# Patient Record
Sex: Male | Born: 1975 | Race: White | Hispanic: No | Marital: Single | Smoking: Never smoker
Health system: Southern US, Community
[De-identification: ages and names within clinical notes are randomized; demographics above are authoritative.]

## PROBLEM LIST (undated history)

## (undated) DIAGNOSIS — R112 Nausea with vomiting, unspecified: Secondary | ICD-10-CM

## (undated) DIAGNOSIS — Z9889 Other specified postprocedural states: Secondary | ICD-10-CM

## (undated) DIAGNOSIS — Z8489 Family history of other specified conditions: Secondary | ICD-10-CM

## (undated) DIAGNOSIS — N200 Calculus of kidney: Secondary | ICD-10-CM

## (undated) DIAGNOSIS — M51369 Other intervertebral disc degeneration, lumbar region without mention of lumbar back pain or lower extremity pain: Secondary | ICD-10-CM

## (undated) DIAGNOSIS — G4730 Sleep apnea, unspecified: Secondary | ICD-10-CM

## (undated) DIAGNOSIS — M5136 Other intervertebral disc degeneration, lumbar region: Secondary | ICD-10-CM

## (undated) DIAGNOSIS — T8859XA Other complications of anesthesia, initial encounter: Secondary | ICD-10-CM

## (undated) DIAGNOSIS — I209 Angina pectoris, unspecified: Secondary | ICD-10-CM

## (undated) DIAGNOSIS — Z87442 Personal history of urinary calculi: Secondary | ICD-10-CM

## (undated) DIAGNOSIS — I10 Essential (primary) hypertension: Secondary | ICD-10-CM

## (undated) HISTORY — PX: INGUINAL HERNIA REPAIR: SUR1180

## (undated) HISTORY — PX: LEG SURGERY: SHX1003

## (undated) HISTORY — PX: FRACTURE SURGERY: SHX138

## (undated) HISTORY — PX: HERNIA REPAIR: SHX51

---

## 2003-05-18 ENCOUNTER — Other Ambulatory Visit: Payer: Self-pay

## 2004-01-14 ENCOUNTER — Emergency Department: Payer: Self-pay | Admitting: Internal Medicine

## 2012-11-08 ENCOUNTER — Emergency Department: Payer: Self-pay | Admitting: Emergency Medicine

## 2012-11-08 IMAGING — CR DG SHOULDER 3+V*R*
1 series · 2 of 2 positions shown · non-contrast
Comparison: none

REASON FOR EXAM: pain and injury
COMMENTS:

PROCEDURE:     DXR - DXR SHOULDER RIGHT COMPLETE  - [DATE]  [DATE]
RESULT:     Right shoulder images demonstrate no fracture, dislocation or
radiopaque foreign body.

[Series 1: external rotate · 0.17mm/px · 2 of 2 slices shown]
[im 1/2]
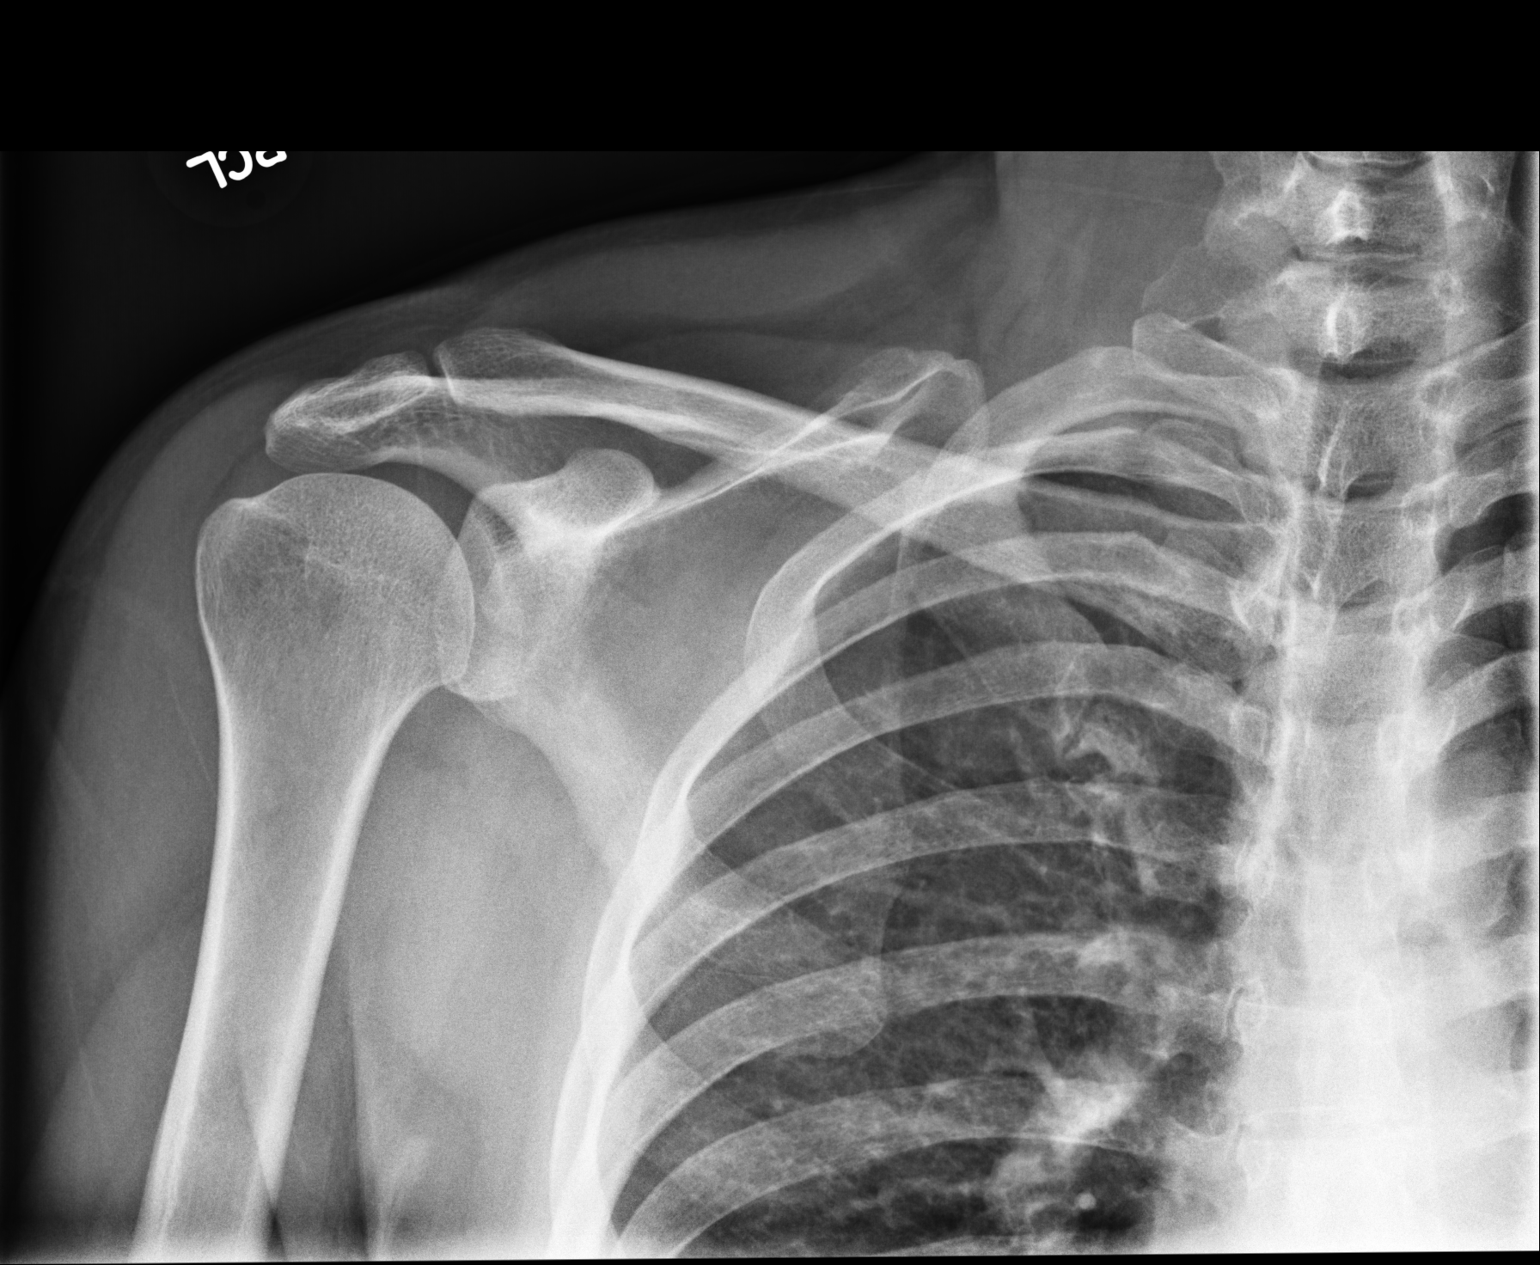
[im 2/2]
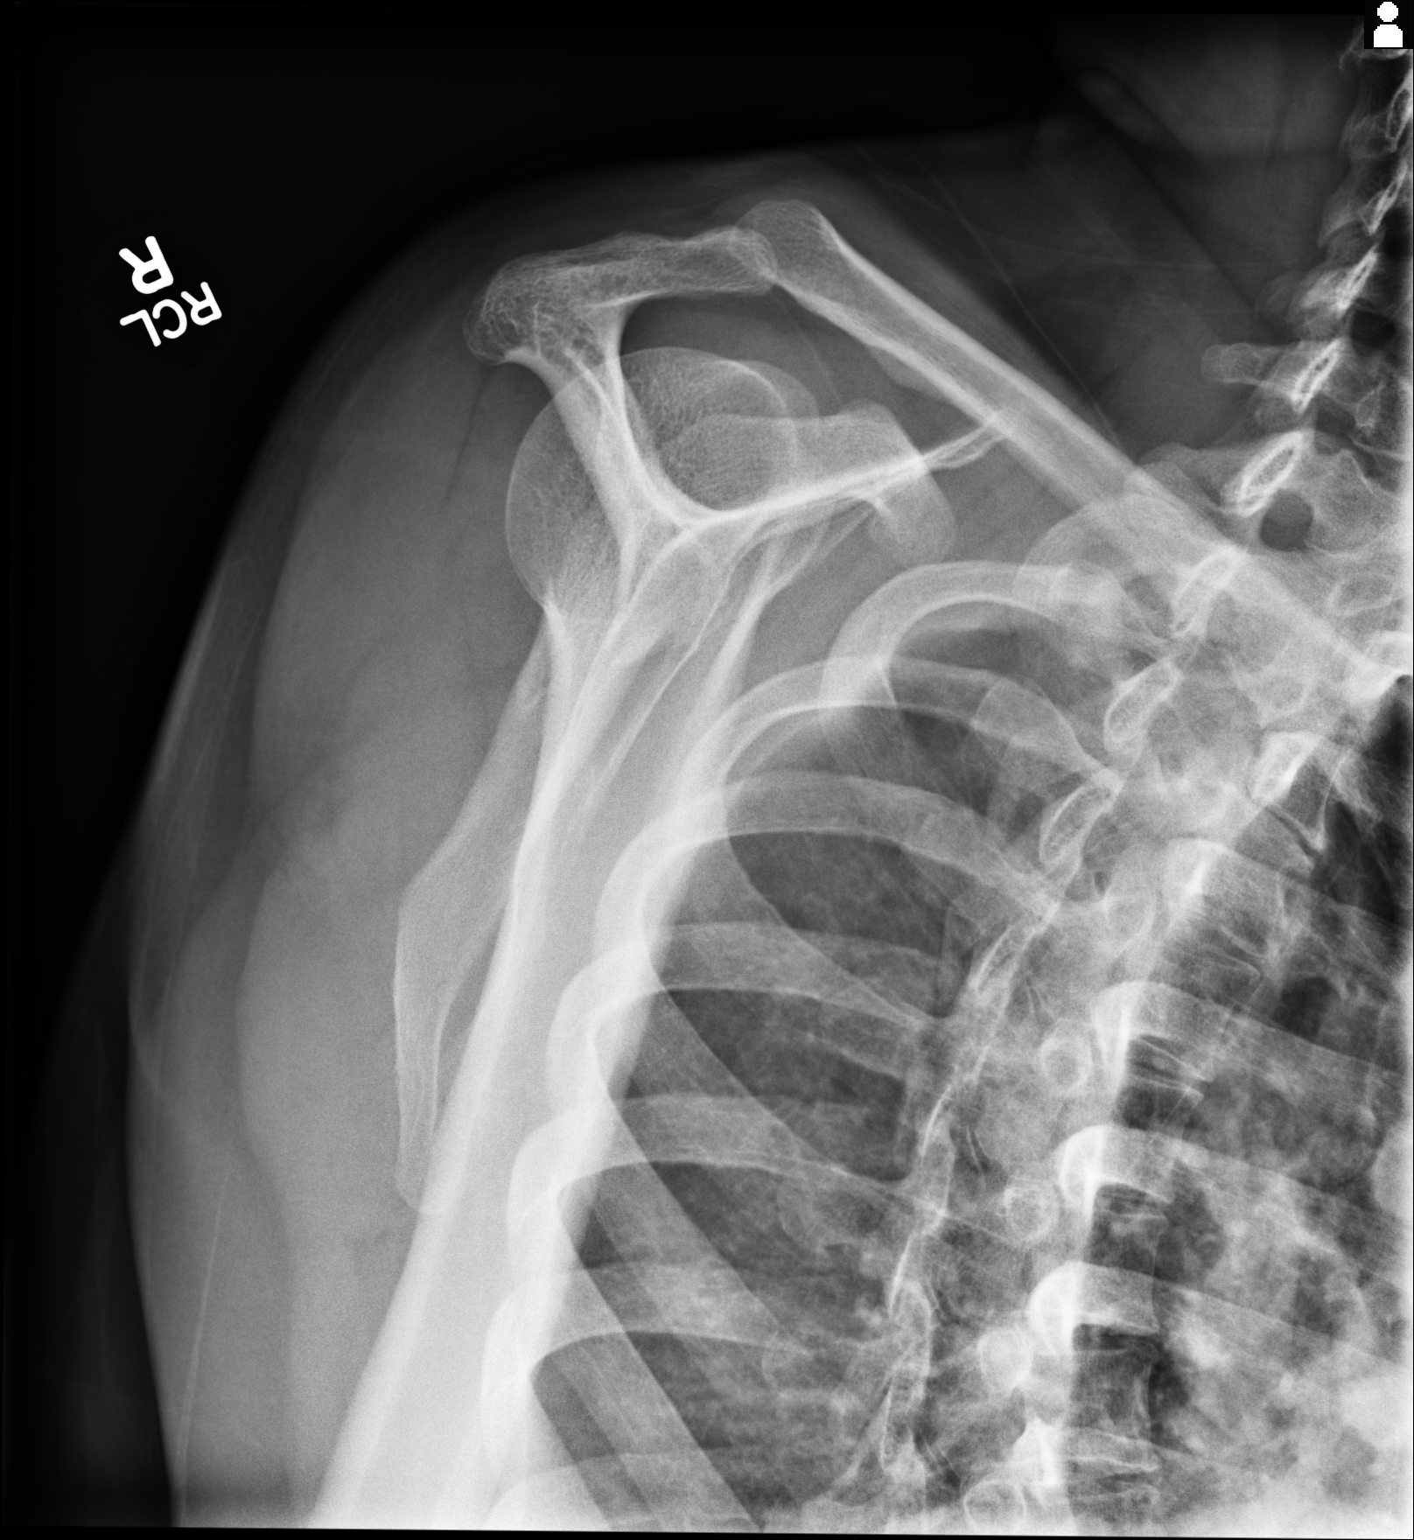

[2 of 2 positions shown; findings below may reference images not displayed]

IMPRESSION: Please see above.

[REDACTED]

## 2012-11-08 IMAGING — CR DG SHOULDER 3+V*R*
1 series · 1 of 1 positions shown · non-contrast
Comparison: none

REASON FOR EXAM: pain and injury
COMMENTS:

PROCEDURE:     DXR - DXR SHOULDER RIGHT COMPLETE  - [DATE]  [DATE]
RESULT:     Right shoulder images demonstrate no fracture, dislocation or
radiopaque foreign body.

[internal rotate]
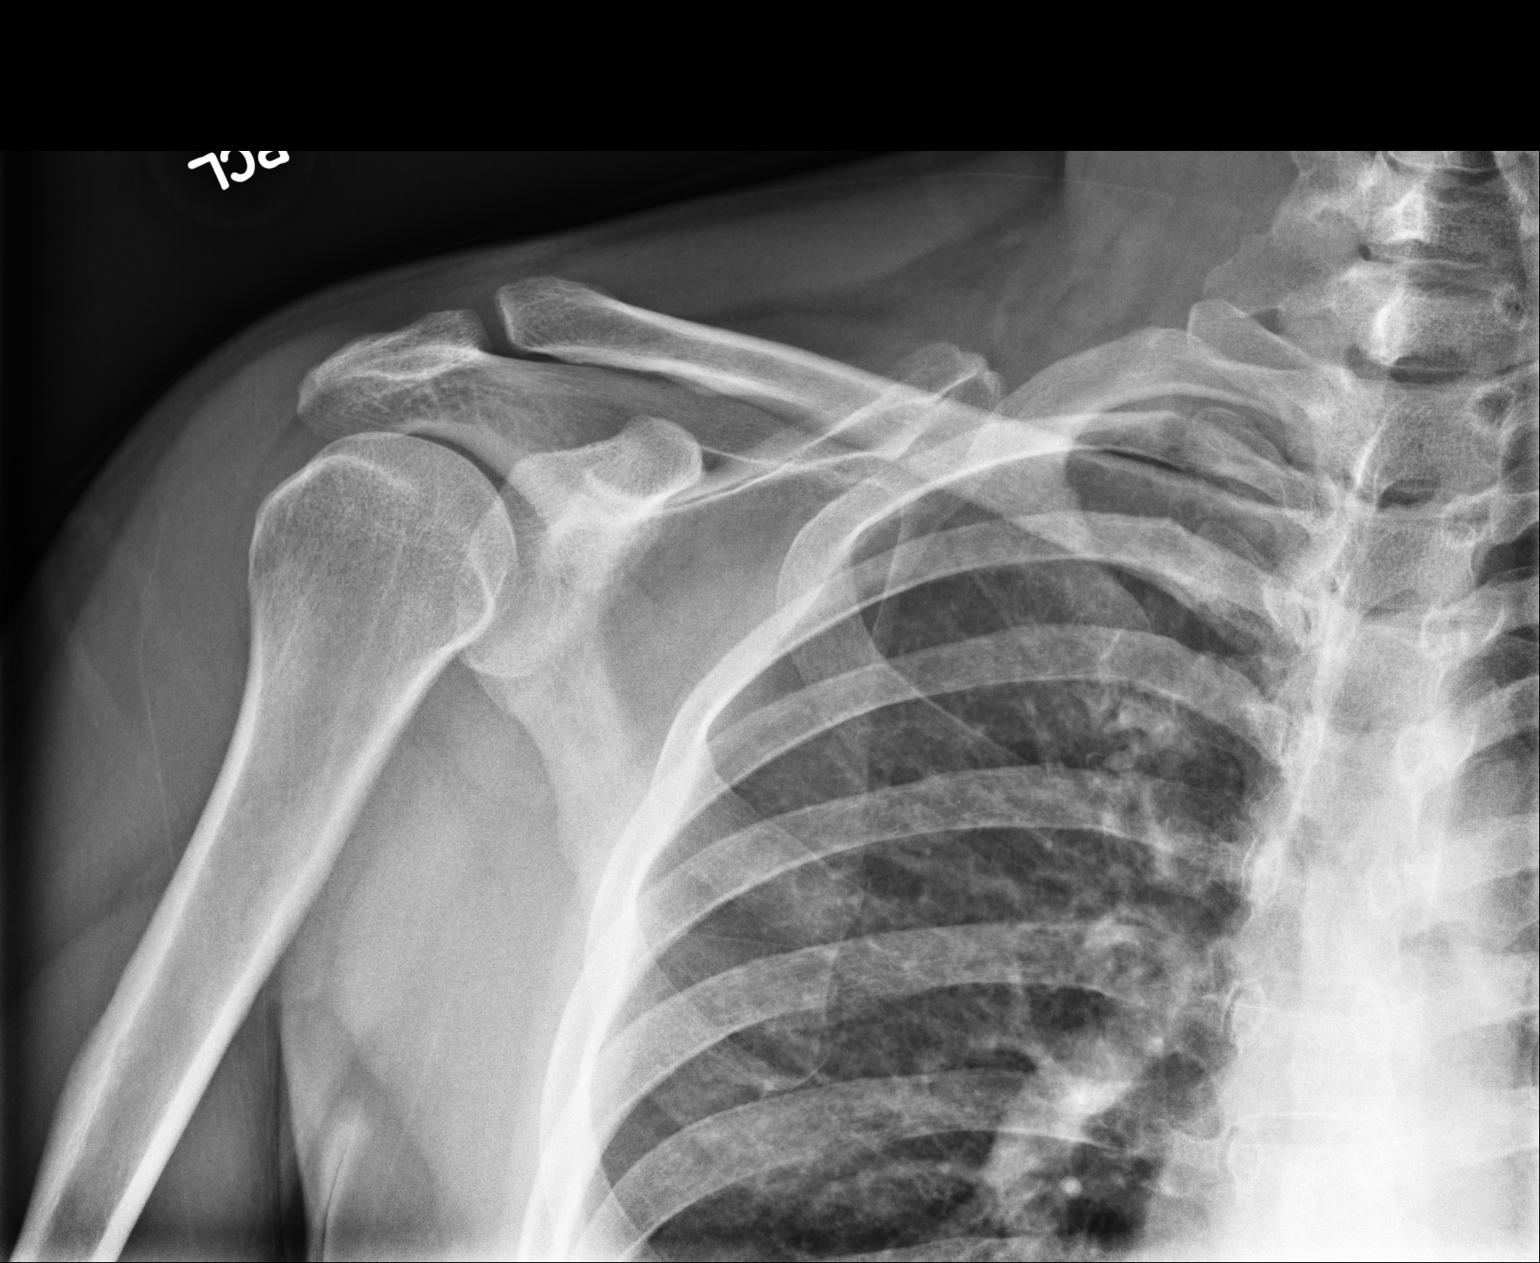

[1 of 1 positions shown; findings below may reference images not displayed]

IMPRESSION: Please see above.

[REDACTED]

## 2012-11-08 IMAGING — CR DG FOOT COMPLETE 3+V*L*
1 series · 3 of 3 positions shown · non-contrast
Comparison: none

REASON FOR EXAM: pain and injury
COMMENTS:   May transport without cardiac monitor

[Series 1: ap · 0.17mm/px · 3 of 3 slices shown]
[im 1/3]
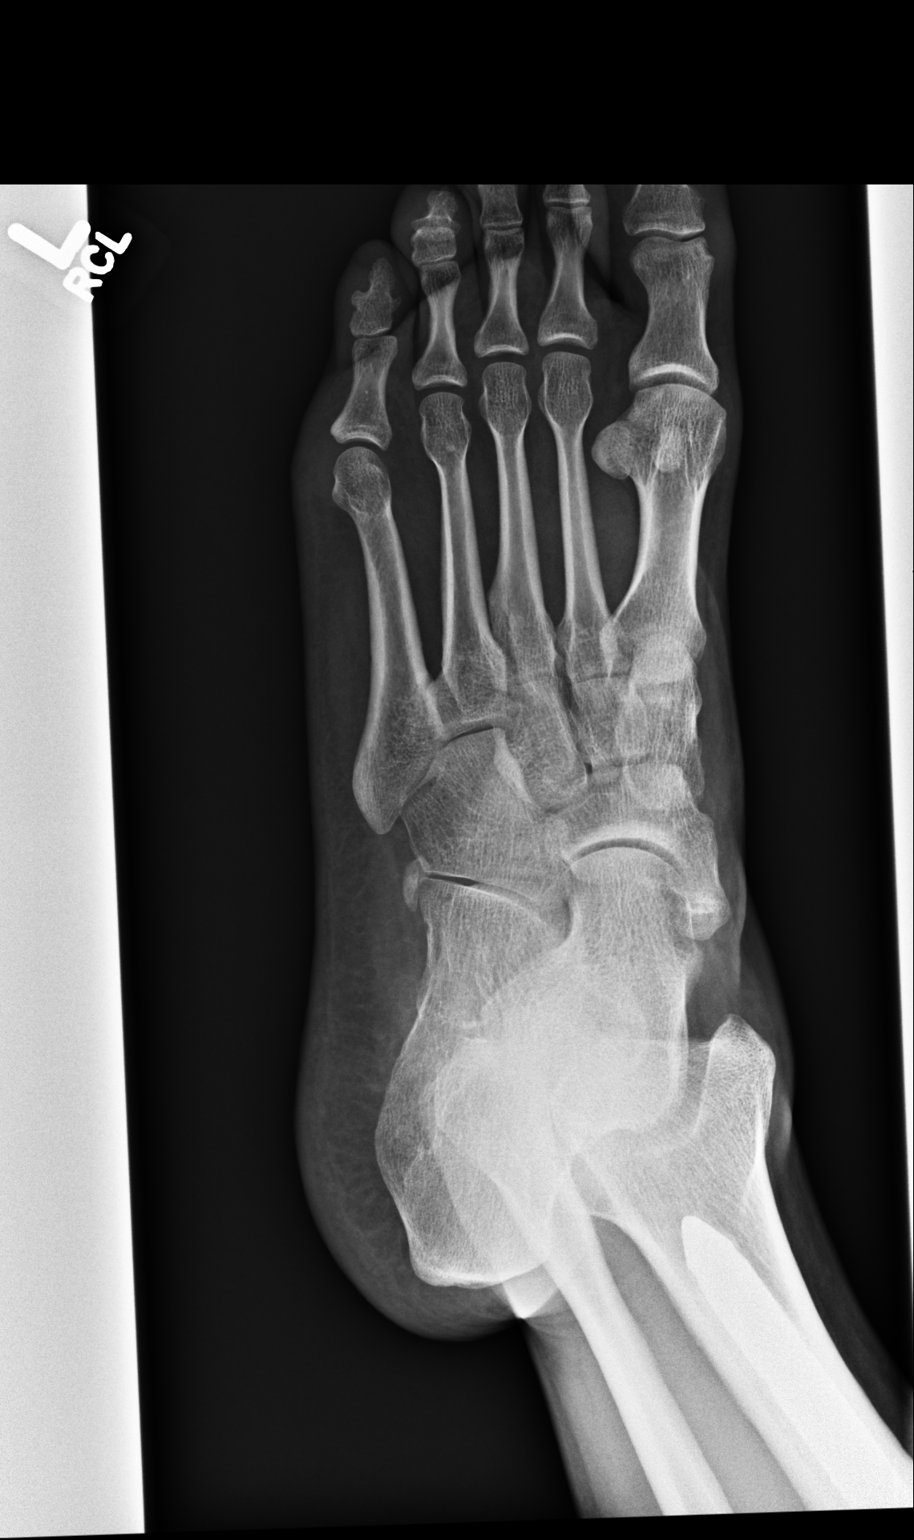
[im 2/3]
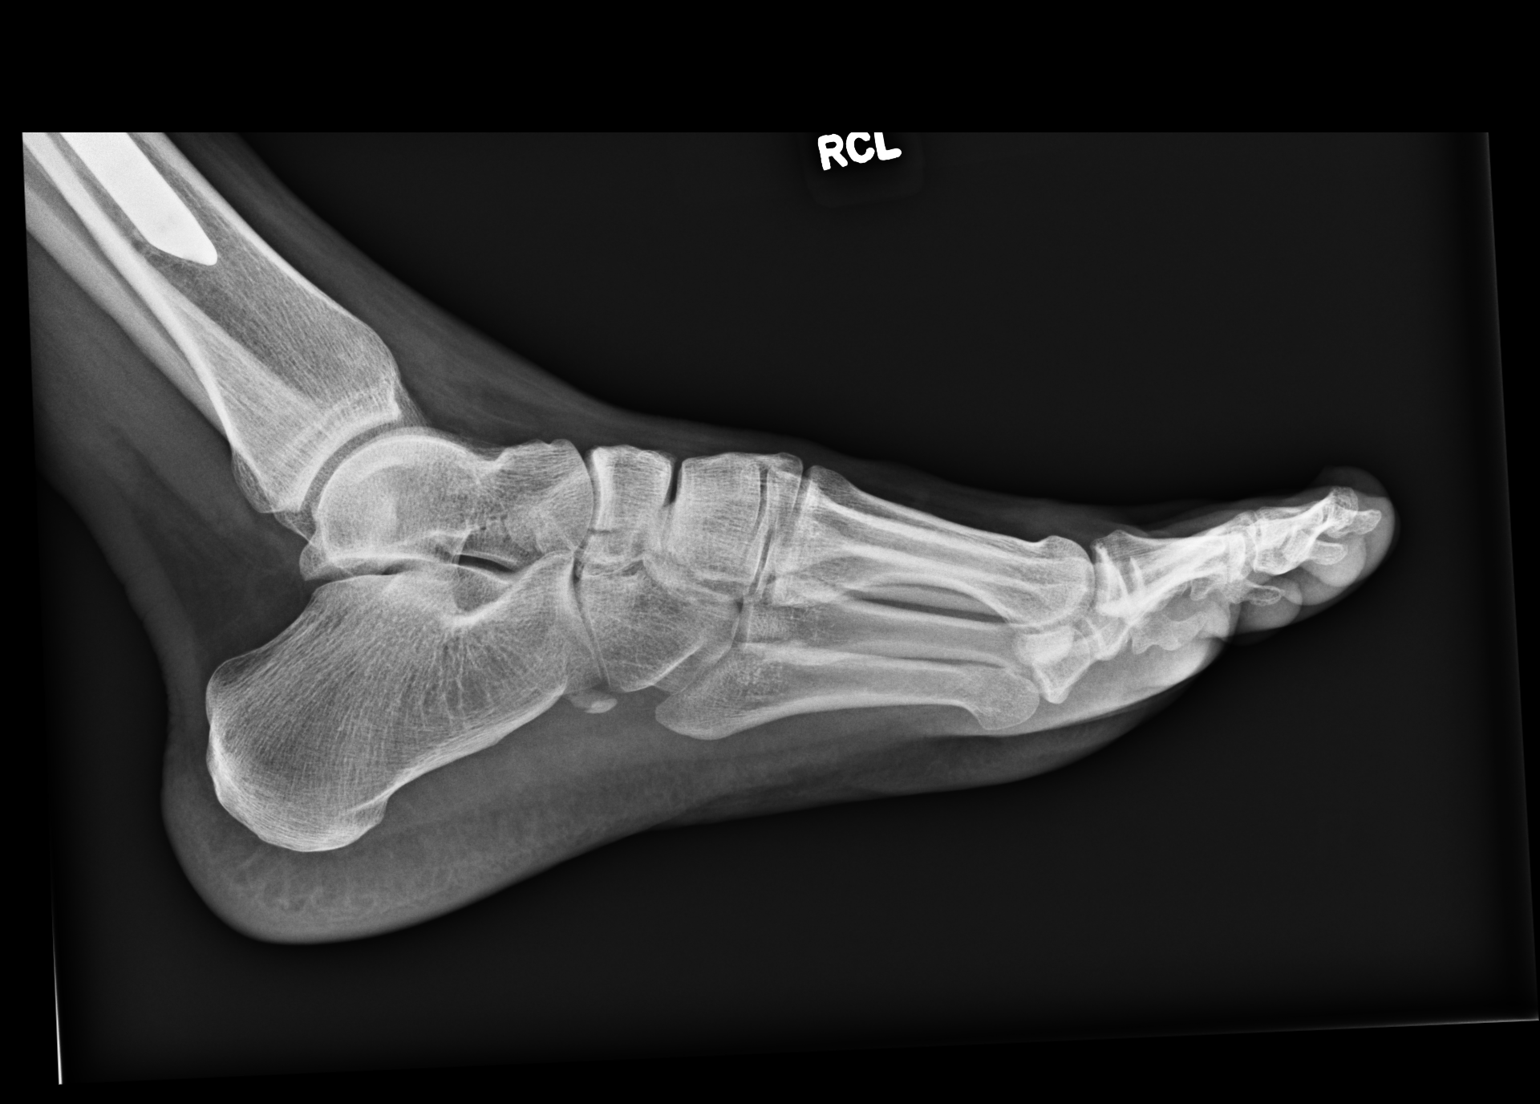
[im 3/3]
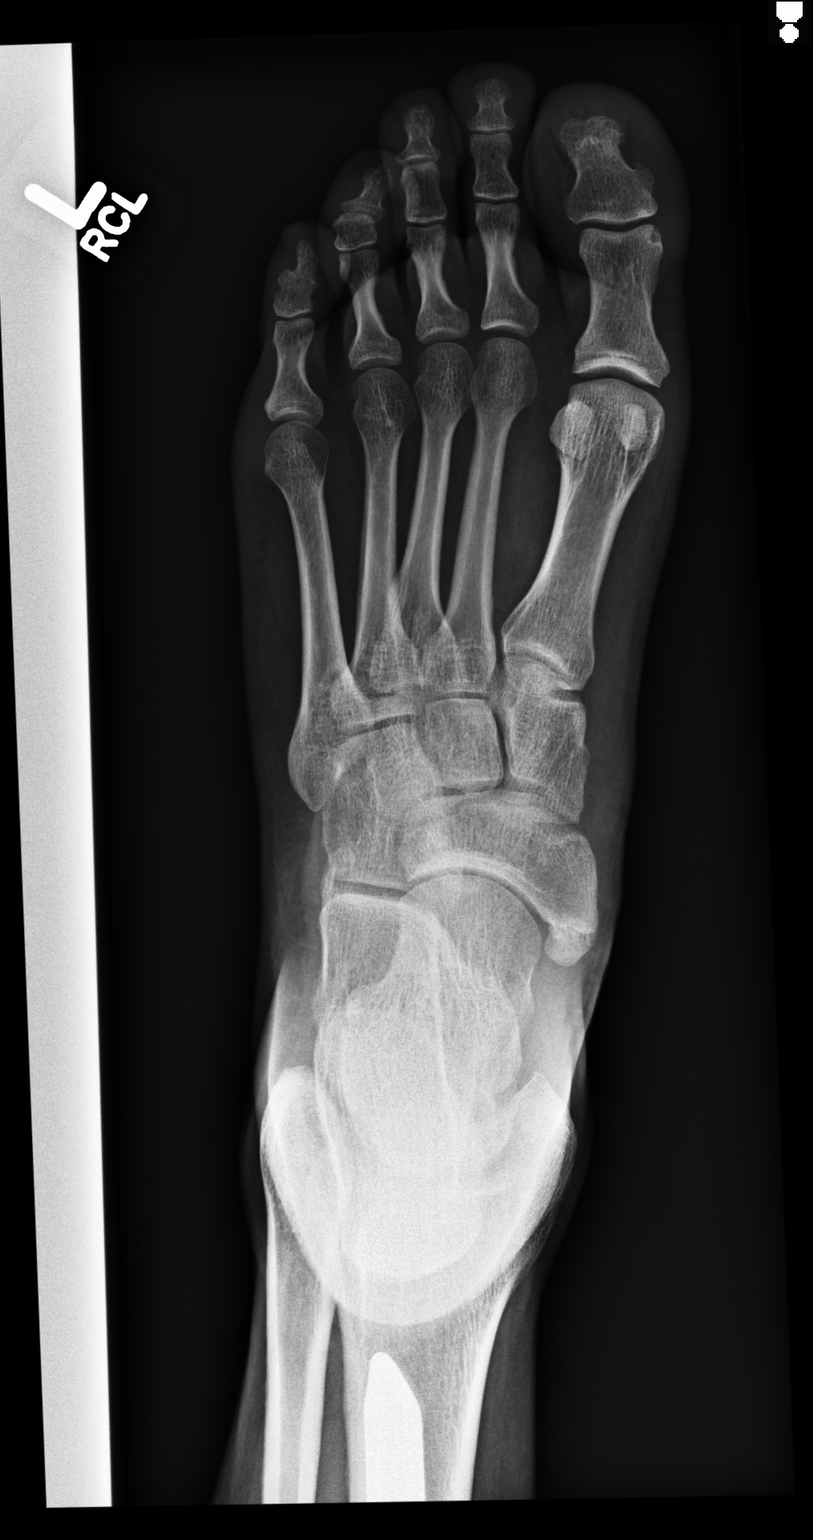

[3 of 3 positions shown; findings below may reference images not displayed]

PROCEDURE:     DXR - DXR FOOT LT COMP W/OBLIQUES  - [DATE]  [DATE]

RESULT:     Left foot images demonstrate lucency in the proximal medial
aspect of the left medial cuneiform concerning for fracture. There some
irregular lucency in the anterior medial tarsal navicular. Correlate for
fracture there. The other bony structures appear intact.
IMPRESSION: Please see above.

[REDACTED]

## 2013-03-07 HISTORY — PX: OTHER SURGICAL HISTORY: SHX169

## 2015-05-10 ENCOUNTER — Encounter: Payer: Self-pay | Admitting: Emergency Medicine

## 2015-05-10 ENCOUNTER — Emergency Department
Admission: EM | Admit: 2015-05-10 | Discharge: 2015-05-10 | Disposition: A | Discharge status: Home / Self Care | Payer: Managed Care, Other (non HMO) | Attending: Emergency Medicine | Admitting: Emergency Medicine

## 2015-05-10 DIAGNOSIS — H1013 Acute atopic conjunctivitis, bilateral: Secondary | ICD-10-CM | POA: Insufficient documentation

## 2015-05-10 DIAGNOSIS — J309 Allergic rhinitis, unspecified: Secondary | ICD-10-CM | POA: Insufficient documentation

## 2015-05-10 DIAGNOSIS — H5713 Ocular pain, bilateral: Secondary | ICD-10-CM | POA: Diagnosis present

## 2015-05-10 MED ORDER — DEXAMETHASONE SODIUM PHOSPHATE 10 MG/ML IJ SOLN
10.0000 mg | Freq: Once | INTRAMUSCULAR | Status: AC
  Administered 2015-05-10: 10 mg via INTRAMUSCULAR

## 2015-05-10 MED ORDER — ONDANSETRON 4 MG PO TBDP
4.0000 mg | ORAL_TABLET | Freq: Once | ORAL | Status: AC
  Administered 2015-05-10: 4 mg via ORAL

## 2015-05-10 MED ORDER — ONDANSETRON 4 MG PO TBDP
ORAL_TABLET | ORAL | Status: AC
  Administered 2015-05-10: 4 mg via ORAL
  Filled 2015-05-10: qty 1

## 2015-05-10 MED ORDER — ONDANSETRON 4 MG PO TBDP
4.0000 mg | ORAL_TABLET | Freq: Once | ORAL | Status: AC
  Administered 2015-05-10: 4 mg via ORAL
  Filled 2015-05-10: qty 1

## 2015-05-10 MED ORDER — DEXAMETHASONE SODIUM PHOSPHATE 10 MG/ML IJ SOLN
INTRAMUSCULAR | Status: AC
  Administered 2015-05-10: 10 mg via INTRAMUSCULAR
  Filled 2015-05-10: qty 1

## 2015-05-10 MED ORDER — OXYMETAZOLINE HCL 0.05 % NA SOLN
2.0000 | Freq: Once | NASAL | Status: AC
  Administered 2015-05-10: 2 via NASAL

## 2015-05-10 MED ORDER — OXYMETAZOLINE HCL 0.05 % NA SOLN
NASAL | Status: AC
  Filled 2015-05-10: qty 15

## 2015-05-10 MED ORDER — OLOPATADINE HCL 0.1 % OP SOLN
1.0000 [drp] | Freq: Two times a day (BID) | OPHTHALMIC | Status: DC
  Administered 2015-05-10: 1 [drp] via OPHTHALMIC
  Filled 2015-05-10 (×2): qty 5

## 2015-05-10 NOTE — Discharge Instructions (Signed)
Allergic Conjunctivitis °Allergic conjunctivitis is inflammation of the clear membrane that covers the white part of your eye and the inner surface of your eyelid (conjunctiva), and it is caused by allergies. The blood vessels in the conjunctiva become inflamed, and this causes the eye to become red or pink, and it often causes itchiness in the eye. Allergic conjunctivitis cannot be spread by one person to another person (noncontagious). °CAUSES °This condition is caused by an allergic reaction. Common causes of an allergic reaction (allergens) include: °1. Dust. °2. Pollen. °3. Mold. °4. Animal dander or secretions. °RISK FACTORS °This condition is more likely to develop if you are exposed to high levels of allergens that cause the allergic reaction. This might include being outdoors when air pollen levels are high or being around animals that you are allergic to. °SYMPTOMS °Symptoms of this condition may include: °1. Eye redness. °2. Tearing of the eyes. °3. Watery eyes. °4. Itchy eyes. °5. Burning feeling in the eyes. °6. Clear drainage from the eyes. °7. Swollen eyelids. °DIAGNOSIS °This condition may be diagnosed by medical history and physical exam. If you have drainage from your eyes, it may be tested to rule out other causes of conjunctivitis. °TREATMENT °Treatment for this condition often includes medicines. These may be eye drops, ointments, or oral medicines. They may be prescription medicines or over-the-counter medicines. °HOME CARE INSTRUCTIONS °· Take or apply medicines only as directed by your health care provider. °· Do not touch or rub your eyes. °· Do not wear contact lenses until the inflammation is gone. Wear glasses instead. °· Do not wear eye makeup until the inflammation is gone. °· Apply a cool, clean washcloth to your eye for 10-20 minutes, 3-4 times a day. °· Try to avoid whatever allergen is causing the allergic reaction. °SEEK MEDICAL CARE IF: °· Your symptoms get worse. °· You have pus  draining from your eye. °· You have new symptoms. °· You have a fever. °  °This information is not intended to replace advice given to you by your health care provider. Make sure you discuss any questions you have with your health care provider. °  °Document Released: 05/14/2002 Document Revised: 03/14/2014 Document Reviewed: 12/03/2013 °Elsevier Interactive Patient Education ©2016 [employer] ° °How to Use Eye Drops and Eye Ointments °HOW TO APPLY EYE DROPS °Follow these steps when applying eye drops: °5. Wash your hands. °6. Tilt your head back. °7. Put a finger under your eye and use it to gently pull your lower lid downward. Keep that finger in place. °8. Using your other hand, hold the dropper between your thumb and index finger. °9. Position the dropper just over the edge of the lower lid. Hold it as close to your eye as you can without touching the dropper to your eye. °10. Steady your hand. One way to do this is to lean your index finger against your brow. °11. Look up. °12. Slowly and gently squeeze one drop of medicine into your eye. °13. Close your eye. °14. Place a finger between your lower eyelid and your nose. Press gently for 2 minutes. This increases the amount of time that the medicine is exposed to the eye. It also reduces side effects that can develop if the drop gets into the bloodstream through the nose. °HOW TO APPLY EYE OINTMENTS °Follow these steps when applying eye ointments: °8. Wash your hands. °9. Put a finger under your eye and use it to gently pull your lower lid downward. Keep that   finger in place. °10. Using your other hand, place the tip of the tube between your thumb and index finger with the remaining fingers braced against your cheek or nose. °11. Hold the tube just over the edge of your lower lid without touching the tube to your lid or eyeball. °12. Look up. °13. Line the inner part of your lower lid with ointment. °14. Gently pull up on your upper lid and look down. This will  force the ointment to spread over the surface of the eye. °15. Release the upper lid. °16. If you can, close your eyes for 1-2 minutes. °Do not rub your eyes. If you applied the ointment correctly, your vision will be blurry for a few minutes. This is normal. °ADDITIONAL INFORMATION °· Make sure to use the eye drops or ointment as told by your health care provider. °· If you have been told to use both eye drops and an eye ointment, apply the eye drops first, then wait 3-4 minutes before you apply the ointment. °· Try not to touch the tip of the dropper or tube to your eye. A dropper or tube that has touched the eye can become contaminated. °  °This information is not intended to replace advice given to you by your health care provider. Make sure you discuss any questions you have with your health care provider. °  °Document Released: 05/30/2000 Document Revised: 07/08/2014 Document Reviewed: 02/17/2014 °Elsevier Interactive Patient Education ©2016 Elsevier Inc[employer] ° °

## 2015-05-10 NOTE — ED Provider Notes (Signed)
CSN: [id number]     Arrival date & time 05/10/15  1909 History   First MD Initiated Contact with Patient 05/10/15 2209     Chief Complaint  Patient presents with  . Facial Pain  . Eye Pain     (Consider location/radiation/quality/duration/timing/severity/associated sxs/prior Treatment) HPI  40 year old male presents to the emergency department for evaluation of bilateral eye drainage, watering, itching. Symptoms have been present since 12:30 PM today. He was cutting wood, is allergic to seizure, denies cutting any seizure. Started developing sneezing, itching, runny nose, went to lie down and his eyes became very watery and pruritic. He developed photophobia. Did take Benadryl and Zyrtec at home which gave him mild improvement. He presents to the ED today with bilateral eye irritation and runny nose with congestion.  History reviewed. No pertinent past medical history. Past Surgical History  Procedure Laterality Date  . Hernia repair     History reviewed. No pertinent family history. Social History  Substance Use Topics  . Smoking status: Never Smoker   . Smokeless tobacco: None  . Alcohol Use: Yes     Comment: occassionally    Review of Systems  Constitutional: Negative.  Negative for fever, chills, activity change and appetite change.  HENT: Negative for congestion, ear pain, mouth sores, rhinorrhea, sinus pressure, sore throat and trouble swallowing.   Eyes: Positive for photophobia, discharge and itching. Negative for pain and redness.  Respiratory: Negative for cough, chest tightness and shortness of breath.   Cardiovascular: Negative for chest pain and leg swelling.  Gastrointestinal: Negative for nausea, vomiting, abdominal pain, diarrhea and abdominal distention.  Genitourinary: Negative for dysuria and difficulty urinating.  Musculoskeletal: Negative for back pain, arthralgias and gait problem.  Skin: Negative for color change and rash.  Neurological: Negative for  dizziness and headaches.  Hematological: Negative for adenopathy.  Psychiatric/Behavioral: Negative for behavioral problems and agitation.      Allergies  Percocet  Home Medications   Prior to Admission medications   Not on File   BP 145/107 mmHg  Pulse 79  Temp(Src) 98.1 F (36.7 C) (Oral)  Resp 18  Ht  (1.778 m)  Wt 90.719 kg  BMI 28.70 kg/m2  SpO2 97% Physical Exam  Constitutional: He is oriented to person, place, and time. He appears well-developed and well-nourished.  HENT:  Head: Normocephalic and atraumatic.  Nose: Nose normal.  Mouth/Throat: Oropharynx is clear and moist.  Eyes: Conjunctivae and EOM are normal. Pupils are equal, round, and reactive to light. Right eye exhibits discharge. Right eye exhibits no exudate. No foreign body present in the right eye. Left eye exhibits discharge. Left eye exhibits no exudate. No foreign body present in the left eye. Right conjunctiva is not injected. Left conjunctiva is not injected. Right eye exhibits normal extraocular motion. Left eye exhibits normal extraocular motion. Right pupil is reactive. Left pupil is reactive.  Diffuse clear drainage from bilateral eyes.  Neck: Normal range of motion. Neck supple.  Cardiovascular: Normal rate, regular rhythm, normal heart sounds and intact distal pulses.   Pulmonary/Chest: Effort normal and breath sounds normal. No respiratory distress. He has no wheezes. He has no rales. He exhibits no tenderness.  Abdominal: Soft. Bowel sounds are normal. He exhibits no distension. There is no tenderness.  Musculoskeletal: Normal range of motion. He exhibits no edema or tenderness.  Neurological: He is alert and oriented to person, place, and time.  Skin: Skin is warm and dry.  Psychiatric: He has a normal mood and  affect. His behavior is normal. Judgment and thought content normal.    ED Course  Procedures (including critical care time) Labs Review Labs Reviewed - No data to  display  Imaging Review No results found. I have personally reviewed and evaluated these images and lab results as part of my medical decision-making.   EKG Interpretation None      MDM   Final diagnoses:  Allergic conjunctivitis and rhinitis, bilateral   40 year old male with bilateral al38lergic conjunctivitis. He responded well to olopatadine eyedrops, oxymetazoline nasal spray, 10 mg of dexamethasone IM. Patient is now able to open his eyes, without photophobia. He has less nasal congestion and less drainage from the eyes. He will follow-up with ophthalmologist. Return to the ER for any worsening symptoms urgent changes in his health.    Evon Slackhomas C Gaines, PA-C 05/10/15 2340  Sharman CheekPhillip Stafford, MD 05/10/15 [phone]

## 2015-05-10 NOTE — ED Notes (Addendum)
Pt c/o eyes swelling shut with pressure behind his eyes, sinus drainage, eyes watering; symptoms started after cutting firewood today but does not feel like anything is in his eyes; no visible swelling noted to pt's face at this time; pt awake and alert; talking in complete coherent sentences; pt can only open eyes just a little before they start watering; vision blurry

## 2015-09-15 ENCOUNTER — Emergency Department
Admission: EM | Admit: 2015-09-15 | Discharge: 2015-09-16 | Disposition: A | Discharge status: Home / Self Care | Payer: Managed Care, Other (non HMO) | Attending: Emergency Medicine | Admitting: Emergency Medicine

## 2015-09-15 ENCOUNTER — Encounter: Payer: Self-pay | Admitting: Emergency Medicine

## 2015-09-15 ENCOUNTER — Emergency Department: Payer: Managed Care, Other (non HMO)

## 2015-09-15 DIAGNOSIS — T782XXA Anaphylactic shock, unspecified, initial encounter: Secondary | ICD-10-CM

## 2015-09-15 DIAGNOSIS — T7840XA Allergy, unspecified, initial encounter: Secondary | ICD-10-CM | POA: Diagnosis present

## 2015-09-15 IMAGING — DX DG CHEST 1V PORT
1 series · 1 of 1 positions shown · non-contrast
Comparison: None.

CLINICAL DATA: 40-year-old male with chest pain

EXAM:
PORTABLE CHEST 1 VIEW

[chest ap]
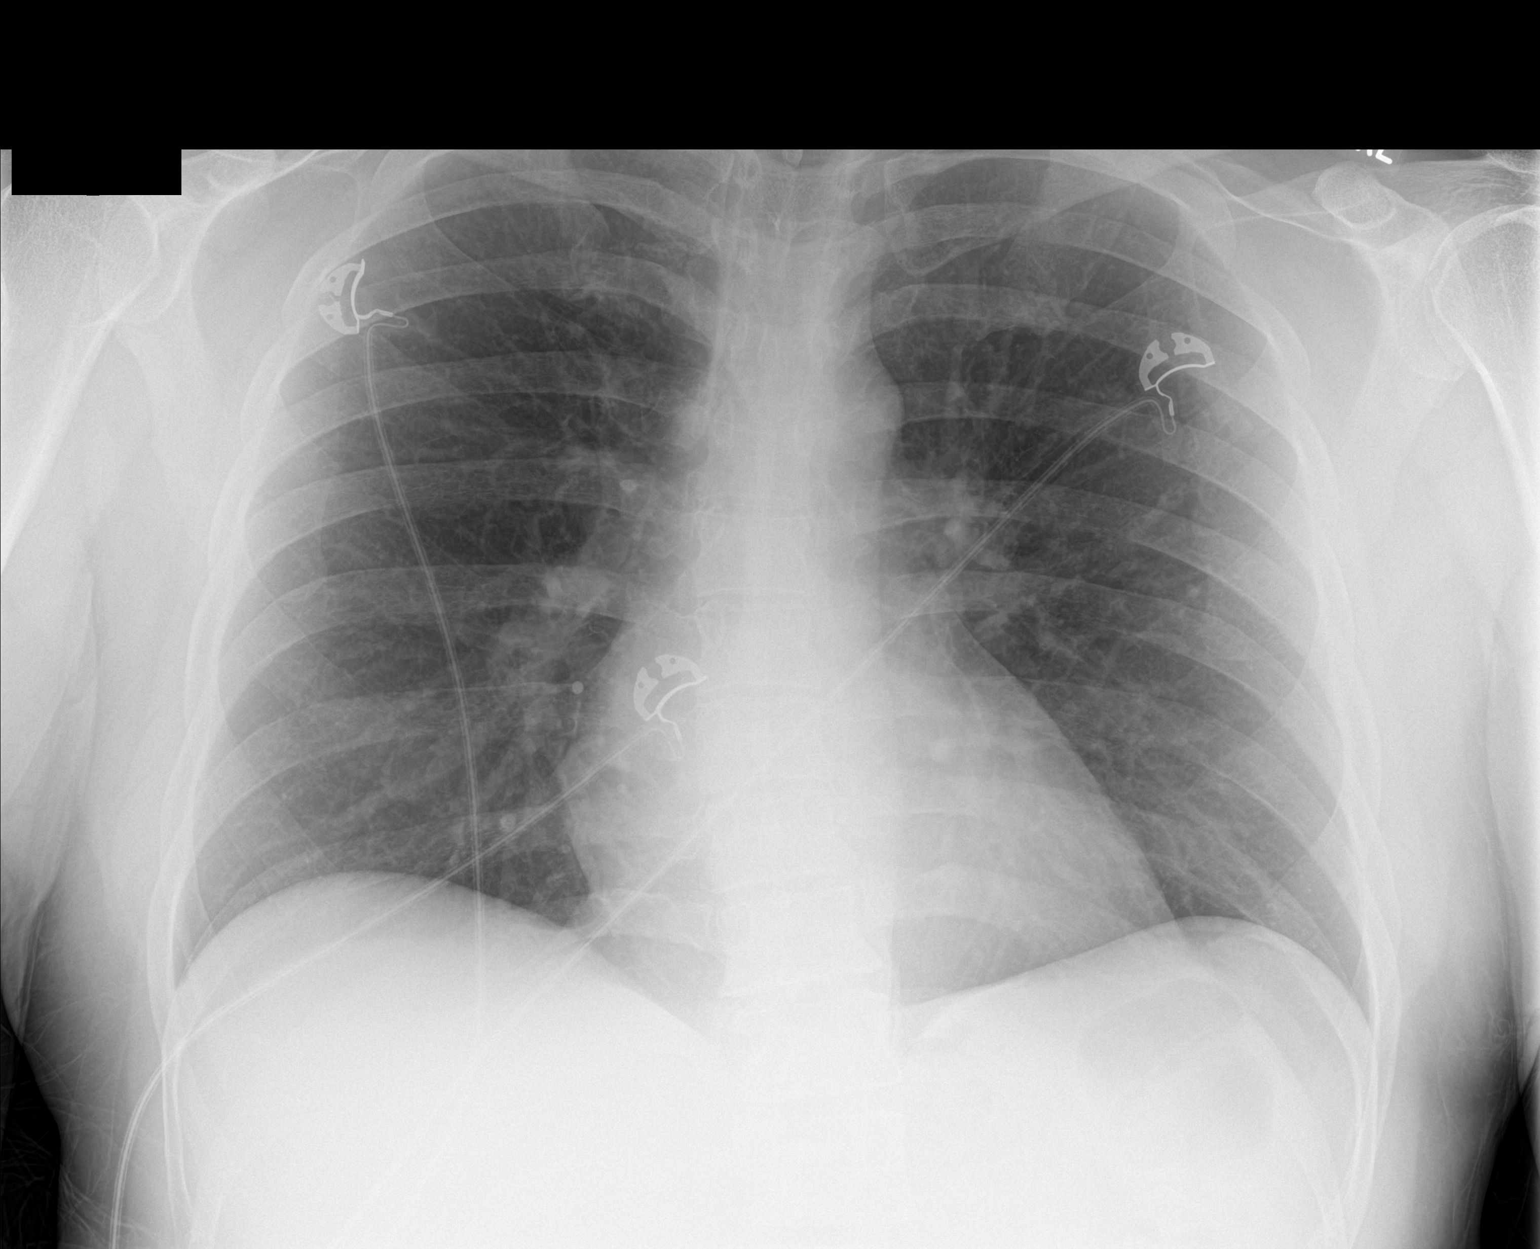

[1 of 1 positions shown; findings below may reference images not displayed]

FINDINGS: The heart size and mediastinal contours are within normal limits.
Both lungs are clear. The visualized skeletal structures are
unremarkable.
IMPRESSION: No active disease.

## 2015-09-15 MED ORDER — ALBUTEROL SULFATE (2.5 MG/3ML) 0.083% IN NEBU
5.0000 mg | INHALATION_SOLUTION | Freq: Once | RESPIRATORY_TRACT | Status: AC
  Administered 2015-09-15: 5 mg via RESPIRATORY_TRACT
  Filled 2015-09-15: qty 6

## 2015-09-15 MED ORDER — EPINEPHRINE 0.3 MG/0.3ML IJ SOAJ: 0.3000 mg | Freq: Once | INTRAMUSCULAR | Status: DC

## 2015-09-15 MED ORDER — DIPHENHYDRAMINE HCL 50 MG/ML IJ SOLN
50.0000 mg | Freq: Once | INTRAMUSCULAR | Status: AC
  Administered 2015-09-15: 50 mg via INTRAVENOUS
  Filled 2015-09-15: qty 1

## 2015-09-15 MED ORDER — METHYLPREDNISOLONE SODIUM SUCC 125 MG IJ SOLR
125.0000 mg | Freq: Once | INTRAMUSCULAR | Status: AC
  Administered 2015-09-15: 125 mg via INTRAVENOUS
  Filled 2015-09-15: qty 2

## 2015-09-15 MED ORDER — FAMOTIDINE IN NACL 20-0.9 MG/50ML-% IV SOLN
20.0000 mg | Freq: Once | INTRAVENOUS | Status: AC
  Administered 2015-09-15: 20 mg via INTRAVENOUS
  Filled 2015-09-15: qty 50

## 2015-09-15 MED ORDER — PREDNISONE 20 MG PO TABS: 60.0000 mg | ORAL_TABLET | Freq: Every day | ORAL | Status: AC

## 2015-09-15 MED ORDER — FAMOTIDINE 20 MG PO TABS: 20.0000 mg | ORAL_TABLET | Freq: Two times a day (BID) | ORAL | Status: DC

## 2015-09-15 MED ORDER — SODIUM CHLORIDE 0.9 % IV BOLUS (SEPSIS)
1000.0000 mL | Freq: Once | INTRAVENOUS | Status: AC
  Administered 2015-09-15: 1000 mL via INTRAVENOUS

## 2015-09-15 MED ORDER — ACETAMINOPHEN 500 MG PO TABS
1000.0000 mg | ORAL_TABLET | Freq: Once | ORAL | Status: AC
  Administered 2015-09-15: 1000 mg via ORAL
  Filled 2015-09-15: qty 2

## 2015-09-15 MED ORDER — EPINEPHRINE 0.3 MG/0.3ML IJ SOAJ
0.3000 mg | Freq: Once | INTRAMUSCULAR | Status: AC
  Administered 2015-09-15: 0.3 mg via INTRAMUSCULAR
  Filled 2015-09-15: qty 0.3

## 2015-09-15 NOTE — ED Provider Notes (Signed)
[facility] Emergency Department Provider Note  ____________________________________________  Time seen: Approximately 9:09 PM  I have reviewed the triage vital signs and the nursing notes.   HISTORY  Chief Complaint Allergic Reaction   HPI Curtis Brown. is a 40 y.o. male no significant past medical history who presents for evaluation of allergic reaction. Patient is a [employer] fund[employer] and reports that he was at work today and started having itching in his legs. He went home and he was about to get in the shower when he noticed that he had hives all over his legs and torso. He then developed some itching in the back of his throat and mild difficulty breathing, no stridor, no chest pain, no diarrhea, no nausea, no syncope, no dizziness. Patient denies any prior history of anaphylaxis or allergic reactions. Denies any new medications, any new foods, any new detergents. He has not have any new exposures.   No past medical history on file.  There are no active problems to display for this patient.   Past Surgical History  Procedure Laterality Date  . Hernia repair      Current Outpatient Rx  Name  Route  Sig  Dispense  Refill  . EPINEPHrine 0.3 mg/0.3 mL IJ SOAJ injection   Intramuscular   Inject 0.3 mLs (0.3 mg total) into the muscle once.   1 Device   1   . famotidine (PEPCID) 20 MG tablet   Oral   Take 1 tablet (20 mg total) by mouth 2 (two) times daily.   30 tablet   0   . predniSONE (DELTASONE) 20 MG tablet   Oral   Take 3 tablets (60 mg total) by mouth daily.   12 tablet   0     Allergies Percocet  No family history on file.  Social History Social History  Substance Use Topics  . Smoking status: Never Smoker   . Smokeless tobacco: Not on file  . Alcohol Use: Yes     Comment: occassionally    Review of Systems  Constitutional: Negative for fever. Eyes: Negative for visual changes. ENT: + closing throat. Cardiovascular: Negative  for chest pain. Respiratory: Negative for shortness of breath. Gastrointestinal: Negative for abdominal pain, vomiting or diarrhea. Genitourinary: Negative for dysuria. Musculoskeletal: Negative for back pain. Skin: + hives Neurological: Negative for headaches, weakness or numbness.  ____________________________________________   PHYSICAL EXAM:  VITAL SIGNS: ED Triage Vitals  Enc Vitals Group     BP 09/15/15 2056 163/98 mmHg     Pulse Rate 09/15/15 2056 80     Resp 09/15/15 2056 18     Temp 09/15/15 2056 98.1 F (36.7 C)     Temp Source 09/15/15 2056 Oral     SpO2 09/15/15 2056 97 %     Weight 09/15/15 2056 196 lb (88.905 kg)     Height 09/15/15 2056  (1.778 m)     Head Cir --      Peak Flow --      Pain Score --      Pain Loc --      Pain Edu? --      Excl. in GC? --     Constitutional: Alert and oriented. Well appearing and in no apparent distress. HEENT:      Head: Normocephalic and atraumatic.         Eyes: Conjunctivae are normal. Sclera is non-icteric. EOMI. PERRL      Mouth/Throat: Mucous membranes are moist.  Airways patent, no swelling of the tongue or uvula, no stridor      Neck: Supple with no signs of meningismus. Cardiovascular: Regular rate and rhythm. No murmurs, gallops, or rubs. 2+ symmetrical distal pulses are present in all extremities. No JVD. Respiratory: Normal respiratory effort. Lungs are clear to auscultation bilaterally. No wheezes, crackles, or rhonchi.  Gastrointestinal: Soft, non tender, and non distended with positive bowel sounds. No rebound or guarding. Genitourinary: No suprapubic tenderness. No CVA tenderness. Musculoskeletal: Nontender with normal range of motion in all extremities. No edema, cyanosis, or erythema of extremities. Neurologic: Normal speech and language. Face is symmetric. Moving all extremities. No gross focal neurologic deficits are appreciated. Skin: Skin is warm, dry and intact. Diffuse hives on all 4 extremities  and torso  Psychiatric: Mood and affect are normal. Speech and behavior are normal.  ____________________________________________   LABS (all labs ordered are listed, but only abnormal results are displayed)  Labs Reviewed - No data to display ____________________________________________  EKG  ED ECG REPORT I, Nita Sicklearolina Aviannah Castoro, the attending physician, personally viewed and interpreted this ECG.  Normal sinus rhythm, rate of 82, normal intervals, normal axis, no ST elevations or depressions. T-wave inversion in lead 3 which is old from prior. ____________________________________________  RADIOLOGY  CXR: negative ____________________________________________   PROCEDURES  Procedure(s) performed: None Critical Care performed:  None ____________________________________________   INITIAL IMPRESSION / ASSESSMENT AND PLAN / ED COURSE  40 y.o. male no significant past medical history who presents for evaluation of allergic reaction with hives and difficulty breathing as he feels like his throat is closing. Patient is hemodynamically stable. Patient was given EpiPen, IV Pepcid, IV Solu-Medrol, IV fluids, and IV Benadryl. Will watch patient on cardiac monitor for 4 hours post EpiPen.  ----------------------------------------- 10:19 PM on 09/15/2015 -----------------------------------------  Patient complaining of bilateral shoulder pain since receiving the EpiPen. Has diminished air movement with some scattered wheezes bilaterally. Will start patient on albuterol. We'll get a portable chest x-ray and an EKG. We'll give him some Tylenol for the shoulder pain  _________________________ 11:37 PM on 09/15/2015 -----------------------------------------  EKG and CXR with no acute findings. No longer wheezing after albuterol. Will obs until 1:15 AM. Care transferred to Dr. Derrill KayGoodman.  Pertinent labs & imaging results that were available during my care of the patient were reviewed by me  and considered in my medical decision making (see chart for details).    ____________________________________________   FINAL CLINICAL IMPRESSION(S) / ED DIAGNOSES  Final diagnoses:  Anaphylaxis, initial encounter      NEW MEDICATIONS STARTED DURING THIS VISIT:  New Prescriptions   EPINEPHRINE 0.3 MG/0.3 ML IJ SOAJ INJECTION    Inject 0.3 mLs (0.3 mg total) into the muscle once.   FAMOTIDINE (PEPCID) 20 MG TABLET    Take 1 tablet (20 mg total) by mouth 2 (two) times daily.   PREDNISONE (DELTASONE) 20 MG TABLET    Take 3 tablets (60 mg total) by mouth daily.     Note:  This document was prepared using Dragon voice recognition software and may include unintentional dictation errors.    Nita Sicklearolina Laniah Grimm, MD 09/15/15 [phone]

## 2015-09-15 NOTE — Discharge Instructions (Signed)
Take 60 mg of prednisone a day for the next 4 days. Take Pepcid 20 mg twice a day for the next 4 days. Take Benadryl every 6 hours for itching. Make sure to fill the prescription for an EpiPen and cary with you at all times. Administer an EpiPen to the lateral thigh if you develop shortness of breath, difficulty breathing, or symptoms of allergic reaction return. After that call 911.  Anaphylactic Reaction An anaphylactic reaction is a sudden, severe allergic reaction that involves the whole body. It can be life threatening. A hospital stay is often required. People with asthma, eczema, or hay fever are slightly more likely to have an anaphylactic reaction. CAUSES  An anaphylactic reaction may be caused by anything to which you are allergic. After being exposed to the allergic substance, your immune system becomes sensitized to it. When you are exposed to that allergic substance again, an allergic reaction can occur. Common causes of an anaphylactic reaction include:  Medicines.  Foods, especially peanuts, wheat, shellfish, milk, and eggs.  Insect bites or stings.  Blood products.  Chemicals, such as dyes, latex, and contrast material used for imaging tests. SYMPTOMS  When an allergic reaction occurs, the body releases histamine and other substances. These substances cause symptoms such as tightening of the airway. Symptoms often develop within seconds or minutes of exposure. Symptoms may include:  Skin rash or hives.  Itching.  Chest tightness.  Swelling of the eyes, tongue, or lips.  Trouble breathing or swallowing.  Lightheadedness or fainting.  Anxiety or confusion.  Stomach pains, vomiting, or diarrhea.  Nasal congestion.  A fast or irregular heartbeat (palpitations). DIAGNOSIS  Diagnosis is based on your history of recent exposure to allergic substances, your symptoms, and a physical exam. Your caregiver may also perform blood or urine tests to confirm the  diagnosis. TREATMENT  Epinephrine medicine is the main treatment for an anaphylactic reaction. Other medicines that may be used for treatment include antihistamines, steroids, and albuterol. In severe cases, fluids and medicine to support blood pressure may be given through an intravenous line (IV). Even if you improve after treatment, you need to be observed to make sure your condition does not get worse. This may require a stay in the hospital. [place] a medical alert bracelet or necklace stating your allergy.  You and your family must learn how to use an anaphylaxis kit or give an epinephrine injection to temporarily treat an emergency allergic reaction. Always carry your epinephrine injection or anaphylaxis kit with you. This can be lifesaving if you have a severe reaction.  Do not drive or perform tasks after treatment until the medicines used to treat your reaction have worn off, or until your caregiver says it is okay.  If you have hives or a rash:  Take medicines as directed by your caregiver.  You may use an over-the-counter antihistamine (diphenhydramine) as needed.  Apply cold compresses to the skin or take baths in cool water. Avoid hot baths or showers. SEEK MEDICAL CARE IF:   You develop symptoms of an allergic reaction to a new substance. Symptoms may start right away or minutes later.  You develop a rash, hives, or itching.  You develop new symptoms. SEEK IMMEDIATE MEDICAL CARE IF:   You have swelling of the mouth, difficulty breathing, or wheezing.  You have a tight feeling in your chest or throat.  You develop hives, swelling, or itching all over your body.  You develop severe vomiting  or diarrhea.  You feel faint or pass out. This is an emergency. Use your epinephrine injection or anaphylaxis kit as you have been instructed. Call your local emergency services (911 in [place] Even if you improve after the injection, you need to be examined at  a hospital emergency department. MAKE SURE YOU:   Understand these instructions.  Will watch your condition.  Will get help right away if you are not doing well or get worse.   This information is not intended to replace advice given to you by your health care provider. Make sure you discuss any questions you have with your health care provider.   Document Released: 02/21/2005 Document Revised: 02/26/2013 Document Reviewed: 09/03/2014 Elsevier Interactive Patient Education [employer].

## 2015-09-15 NOTE — ED Notes (Signed)
Pt in with co allergic reaction has been itching since this am started having hives and trouble breathing 30 min pta

## 2015-09-16 NOTE — ED Provider Notes (Signed)
-----------------------------------------   1:22 AM on 09/16/2015 -----------------------------------------  Patient states that he continues to feel better. He feels like the symptoms have improved significantly. I did discuss with patient the prescriptions that Dr. Don PerkingVeronese prepared for him.  Curtis SemenGraydon Dezire Turk, MD 09/16/15 [phone]

## 2017-11-01 ENCOUNTER — Inpatient Hospital Stay
Admit: 2017-11-01 | Discharge: 2017-11-01 | Disposition: A | Discharge status: Home / Self Care | Payer: Managed Care, Other (non HMO) | Attending: Internal Medicine | Admitting: Internal Medicine

## 2017-11-01 ENCOUNTER — Encounter: Payer: Self-pay | Admitting: *Deleted

## 2017-11-01 ENCOUNTER — Observation Stay
Admission: EM | Admit: 2017-11-01 | Discharge: 2017-11-02 | DRG: 311 | Disposition: A | Discharge status: Home / Self Care | Payer: Managed Care, Other (non HMO) | Attending: Internal Medicine | Admitting: Internal Medicine

## 2017-11-01 ENCOUNTER — Other Ambulatory Visit: Payer: Self-pay

## 2017-11-01 ENCOUNTER — Emergency Department: Payer: Managed Care, Other (non HMO)

## 2017-11-01 DIAGNOSIS — Z79899 Other long term (current) drug therapy: Secondary | ICD-10-CM

## 2017-11-01 DIAGNOSIS — Z885 Allergy status to narcotic agent status: Secondary | ICD-10-CM | POA: Diagnosis not present

## 2017-11-01 DIAGNOSIS — Z8249 Family history of ischemic heart disease and other diseases of the circulatory system: Secondary | ICD-10-CM | POA: Diagnosis not present

## 2017-11-01 DIAGNOSIS — I10 Essential (primary) hypertension: Secondary | ICD-10-CM | POA: Diagnosis not present

## 2017-11-01 DIAGNOSIS — I200 Unstable angina: Principal | ICD-10-CM | POA: Diagnosis present

## 2017-11-01 LAB — HEPATIC FUNCTION PANEL
ALT: 30 U/L (ref 0–44)
AST: 26 U/L (ref 15–41)
Albumin: 4.2 g/dL (ref 3.5–5.0)
Alkaline Phosphatase: 72 U/L (ref 38–126)
BILIRUBIN INDIRECT: 0.7 mg/dL (ref 0.3–0.9)
Bilirubin, Direct: 0.1 mg/dL (ref 0.0–0.2)
TOTAL PROTEIN: 7.7 g/dL (ref 6.5–8.1)
Total Bilirubin: 0.8 mg/dL (ref 0.3–1.2)

## 2017-11-01 LAB — CBC
HCT: 48.4 % (ref 40.0–52.0)
HEMOGLOBIN: 16.4 g/dL (ref 13.0–18.0)
MCH: 29.1 pg (ref 26.0–34.0)
MCHC: 33.9 g/dL (ref 32.0–36.0)
MCV: 85.9 fL (ref 80.0–100.0)
PLATELETS: 207 10*3/uL (ref 150–440)
RBC: 5.63 MIL/uL (ref 4.40–5.90)
RDW: 13.1 % (ref 11.5–14.5)
WBC: 6.2 10*3/uL (ref 3.8–10.6)

## 2017-11-01 LAB — LIPID PANEL
CHOL/HDL RATIO: 4.4 ratio
Cholesterol: 150 mg/dL (ref 0–200)
HDL: 34 mg/dL — AB (ref >40)
LDL Cholesterol: 96 mg/dL (ref 0–99)
TRIGLYCERIDES: 98 mg/dL (ref <150)
VLDL: 20 mg/dL (ref 0–40)

## 2017-11-01 LAB — LIPASE, BLOOD: Lipase: 26 U/L (ref 11–51)

## 2017-11-01 LAB — BASIC METABOLIC PANEL
ANION GAP: 7 (ref 5–15)
BUN: 14 mg/dL (ref 6–20)
CALCIUM: 9.4 mg/dL (ref 8.9–10.3)
CO2: 26 mmol/L (ref 22–32)
CREATININE: 0.71 mg/dL (ref 0.61–1.24)
Chloride: 103 mmol/L (ref 98–111)
GFR calc non Af Amer: >60 mL/min (ref >60)
Glucose, Bld: 99 mg/dL (ref 70–99)
Potassium: 3.7 mmol/L (ref 3.5–5.1)
SODIUM: 136 mmol/L (ref 135–145)

## 2017-11-01 LAB — HEMOGLOBIN A1C
Hgb A1c MFr Bld: 5 % (ref 4.8–5.6)
Mean Plasma Glucose: 96.8 mg/dL

## 2017-11-01 LAB — TROPONIN I

## 2017-11-01 LAB — APTT: APTT: 32 s (ref 24–36)

## 2017-11-01 LAB — PROTIME-INR
INR: 0.94
PROTHROMBIN TIME: 12.5 s (ref 11.4–15.2)

## 2017-11-01 IMAGING — CR DG CHEST 2V
2 series · 2 of 2 positions shown · non-contrast
Comparison: Radiograph [DATE].

CLINICAL DATA: Chest pain.

EXAM:
CHEST - 2 VIEW

[chest pa]
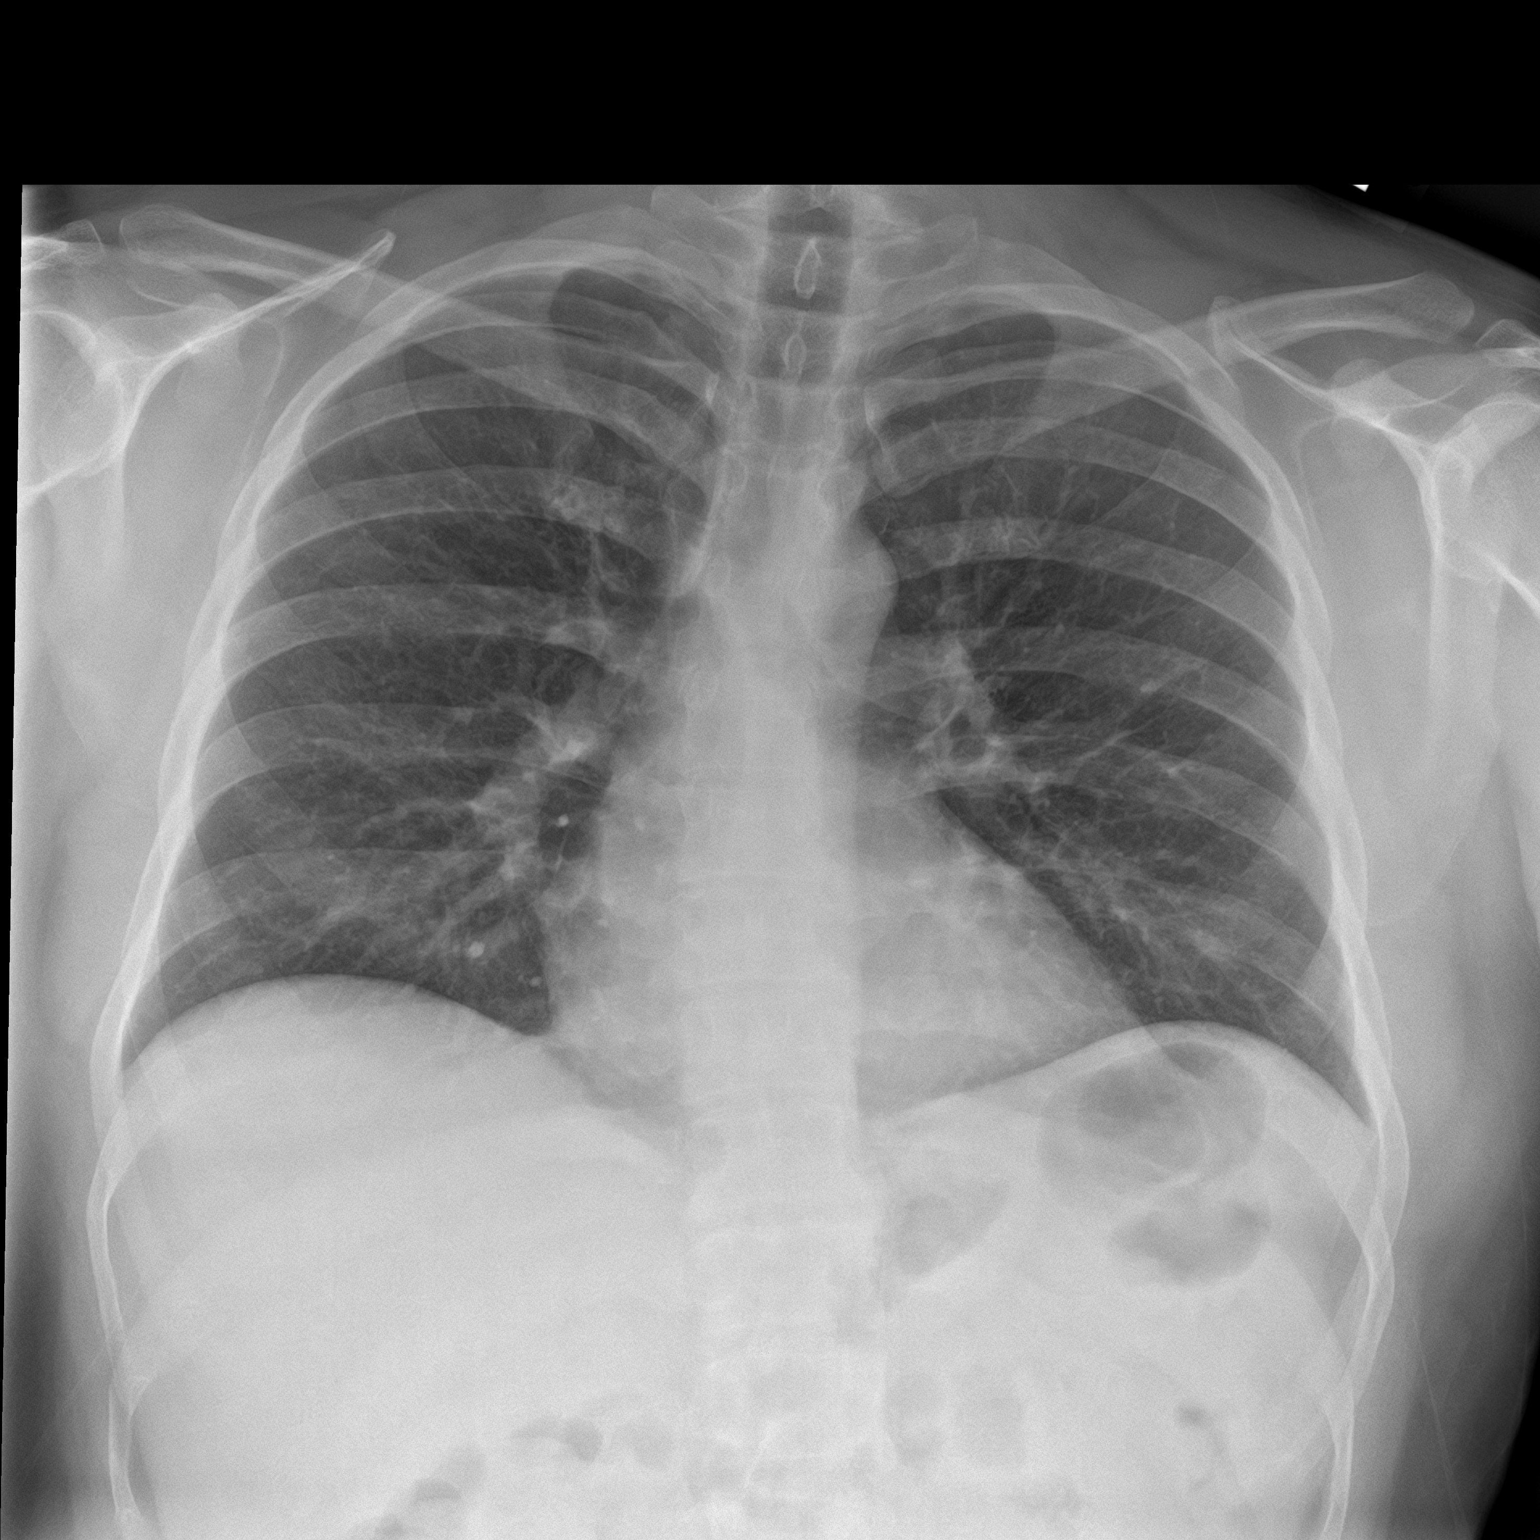

[chest lat]
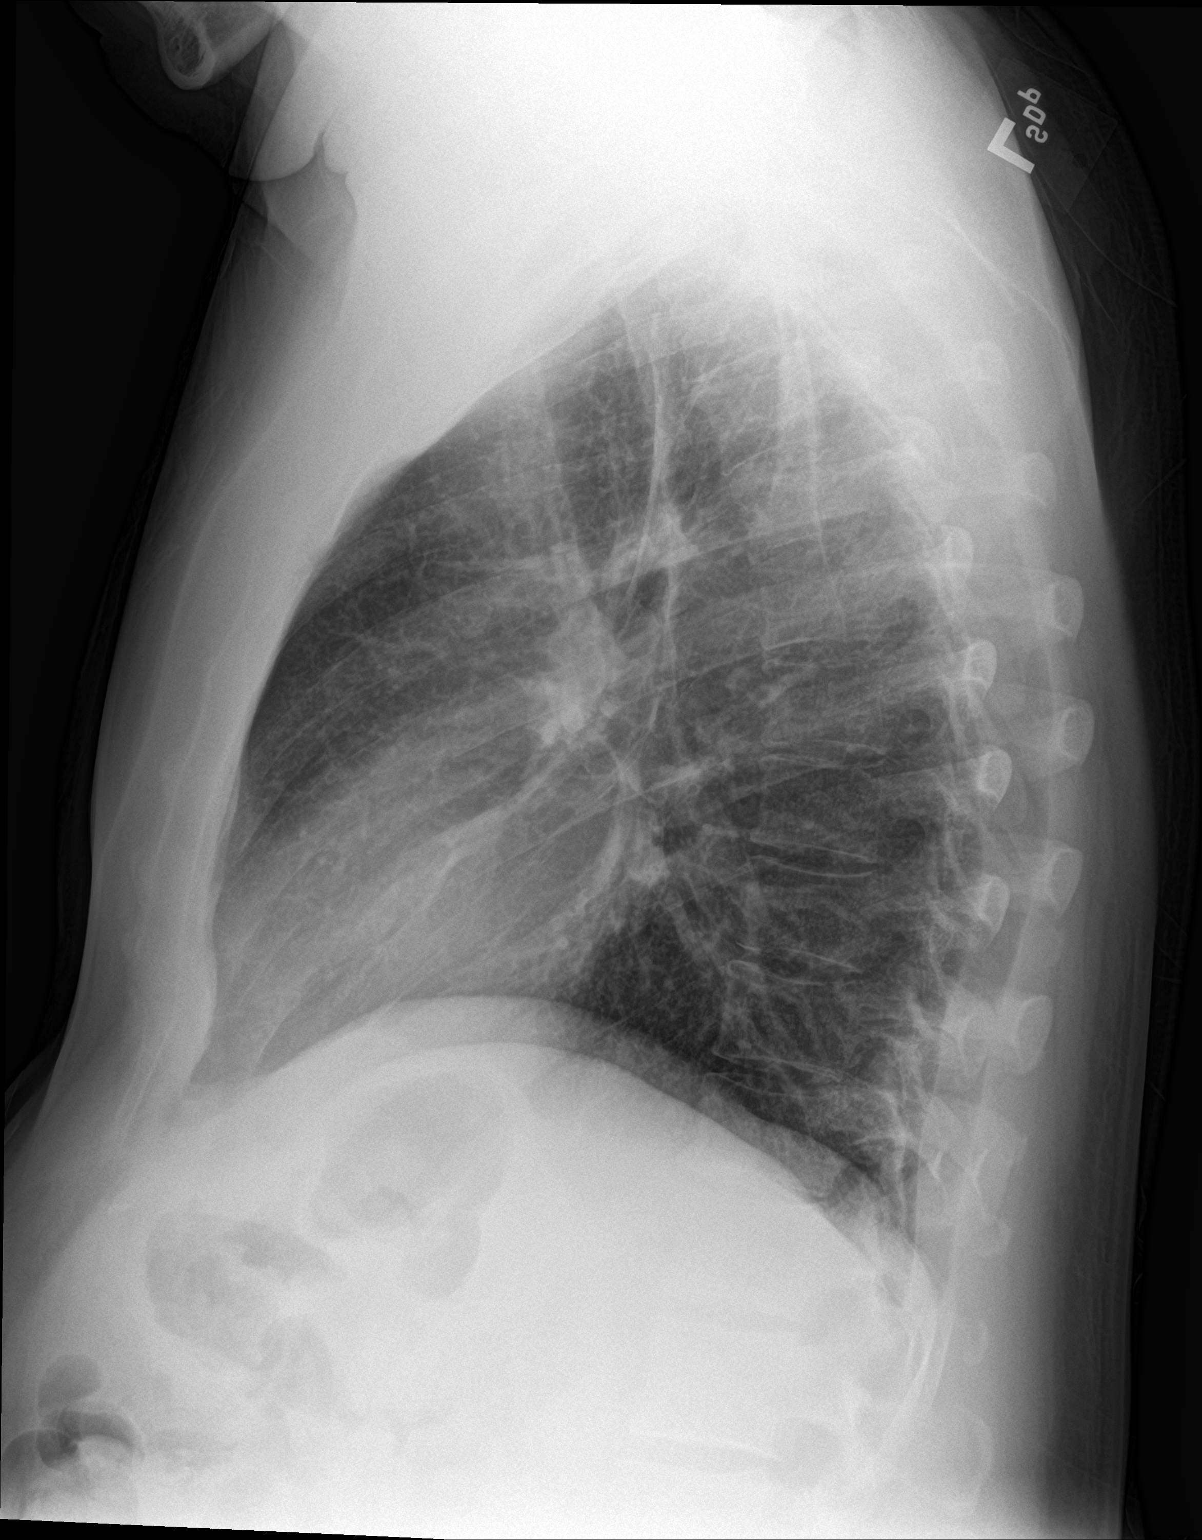

[2 of 2 positions shown; findings below may reference images not displayed]

FINDINGS: The heart size and mediastinal contours are within normal limits.
Both lungs are clear. No pneumothorax or pleural effusion is noted.
The visualized skeletal structures are unremarkable.
IMPRESSION: No active cardiopulmonary disease.

## 2017-11-01 MED ORDER — LOSARTAN POTASSIUM 25 MG PO TABS
25.0000 mg | ORAL_TABLET | Freq: Every day | ORAL | Status: DC
  Administered 2017-11-01 – 2017-11-02 (×2): 25 mg via ORAL
  Filled 2017-11-01 (×2): qty 1

## 2017-11-01 MED ORDER — MORPHINE SULFATE (PF) 2 MG/ML IV SOLN: 2.0000 mg | INTRAVENOUS | Status: DC | PRN

## 2017-11-01 MED ORDER — POLYETHYLENE GLYCOL 3350 17 G PO PACK: 17.0000 g | PACK | Freq: Every day | ORAL | Status: DC | PRN

## 2017-11-01 MED ORDER — NITROGLYCERIN 2 % TD OINT
0.5000 [in_us] | TOPICAL_OINTMENT | Freq: Four times a day (QID) | TRANSDERMAL | Status: DC
  Administered 2017-11-01 – 2017-11-02 (×3): 0.5 [in_us] via TOPICAL
  Filled 2017-11-01 (×3): qty 1

## 2017-11-01 MED ORDER — HEPARIN SODIUM (PORCINE) 5000 UNIT/ML IJ SOLN: 4000.0000 [IU] | Freq: Once | INTRAMUSCULAR | Status: DC

## 2017-11-01 MED ORDER — HEPARIN BOLUS VIA INFUSION
4000.0000 [IU] | Freq: Once | INTRAVENOUS | Status: AC
  Administered 2017-11-01: 4000 [IU] via INTRAVENOUS
  Filled 2017-11-01: qty 4000

## 2017-11-01 MED ORDER — TRAMADOL HCL 50 MG PO TABS: 50.0000 mg | ORAL_TABLET | Freq: Four times a day (QID) | ORAL | Status: DC | PRN

## 2017-11-01 MED ORDER — SODIUM CHLORIDE 0.9 % IV SOLN
INTRAVENOUS | Status: DC
  Administered 2017-11-02: 01:00:00 via INTRAVENOUS

## 2017-11-01 MED ORDER — HYDRALAZINE HCL 20 MG/ML IJ SOLN
10.0000 mg | Freq: Four times a day (QID) | INTRAMUSCULAR | Status: DC | PRN
  Filled 2017-11-01: qty 0.5

## 2017-11-01 MED ORDER — ASPIRIN 81 MG PO CHEW
81.0000 mg | CHEWABLE_TABLET | Freq: Every day | ORAL | Status: DC
  Administered 2017-11-02: 81 mg via ORAL
  Filled 2017-11-01: qty 1

## 2017-11-01 MED ORDER — ONDANSETRON HCL 4 MG PO TABS: 4.0000 mg | ORAL_TABLET | Freq: Four times a day (QID) | ORAL | Status: DC | PRN

## 2017-11-01 MED ORDER — ONDANSETRON HCL 4 MG/2ML IJ SOLN: 4.0000 mg | Freq: Four times a day (QID) | INTRAMUSCULAR | Status: DC | PRN

## 2017-11-01 MED ORDER — ASPIRIN 81 MG PO CHEW
324.0000 mg | CHEWABLE_TABLET | Freq: Once | ORAL | Status: AC
  Administered 2017-11-01: 324 mg via ORAL
  Filled 2017-11-01: qty 4

## 2017-11-01 MED ORDER — HEPARIN (PORCINE) IN NACL 100-0.45 UNIT/ML-% IJ SOLN
1200.0000 [IU]/h | INTRAMUSCULAR | Status: DC
  Administered 2017-11-01: 1200 [IU]/h via INTRAVENOUS
  Filled 2017-11-01 (×2): qty 250

## 2017-11-01 NOTE — Progress Notes (Signed)
ANTICOAGULATION CONSULT NOTE - Initial Consult  Pharmacy Consult for heparin Indication: ACS/unstable angina  Allergies  Allergen Reactions  . Percocet [Oxycodone-Acetaminophen] Hives    Patient Measurements: Height: 5\' 10"  (177.8 cm) Weight: 206 lb (93.4 kg) IBW/kg (Calculated) : 73 Heparin Dosing Weight: 91.9 kg  Vital Signs: Temp: 98 F (36.7 C) (08/28 1518) Temp Source: Oral (08/28 1518) BP: 139/99 (08/28 1740) Pulse Rate: 71 (08/28 1740)  Labs: Recent Labs    11/01/17 1522  HGB 16.4  HCT 48.4  PLT 207  CREATININE 0.71  TROPONINI <0.03    Estimated Creatinine Clearance: 135.3 mL/min (by C-G formula based on SCr of 0.71 mg/dL).   Medical History: History reviewed. No pertinent past medical history.  Medications:  Infusions:  . heparin      Assessment: 42 yom cc CP while climbing stairs at work. [facility] but worsening symptoms at clinic prompted ED eval. Initian troponin <0.03 x 1. EKG with possible left atrial enlargement but normal sinus rhythm. Pharmacy consulted to dose heparin for unstable angina. PTA list is not complete - will follow up.   Goal of Therapy:  Heparin level 0.3-0.7 units/ml Monitor platelets by anticoagulation protocol: Yes   Plan:  Give 4000 units bolus x 1 Start heparin infusion at 1200 units/hr Check anti-Xa level in 6 hours and daily while on heparin Continue to monitor H&H and platelets  Luz BrazenNathan A Mykael Trott 11/01/2017,5:54 PM

## 2017-11-01 NOTE — ED Triage Notes (Signed)
[facility] reporting sharp [facility]. [facility] noting increased fatigue while climbing stairs at work. Pt was requesting a stress test but while waiting in the lobby pt became weak and reports all extremities grew numb and his chest pain spiked to a 8/10. Pt was unable to stand and reports a near syncopal episode. No cardiac hx reported.   Elevated BP for the past couple months with intermittent headaches. No neuro deficits noted at this time.

## 2017-11-01 NOTE — H&P (Signed)
[facility] [place] [facility]   PATIENT NAME: Curtis Brown    MR#:  [id number]  DATE OF BIRTH:  04-04-1975  DATE OF ADMISSION:  11/01/2017  PRIMARY CARE PHYSICIAN: Dan Humphreys, [facility]   REQUESTING/REFERRING PHYSICIAN: dr York Cerise  CHIEF COMPLAINT:   Chest pain HISTORY OF PRESENT ILLNESS:  Curtis Brown  is a 42 y.o. male with no past medical history who presents today to the emergency room due to chest pain.  Patient reports over the past month he has had increasing shortness of breath when walking up a flight of steps.  However he has not had chest pain.  2 days ago he had his first episode of substernal chest pain with radiation to his left arm associated with shortness of breath and nausea.  He has had a few episodes of chest pain that time.  No relieving or aggravating factors.  He also states that his blood pressure was elevated which was recently diagnosed and he was started on hypertensive medication.  He was referred to [facility] clinic cardiology due to chest pain by his primary care physician. Patient went to his appointment at the cardiologist office when he developed sudden onset of 9 out of 10 substernal chest pain with radiation to his left arm and numbness to his left arm.  He developed shortness of breath as well.  He was sent to the ER for further evaluation. In the emergency room his first set of troponins are negative.  He is currently chest pain-free.  He received aspirin and has been started on heparin drip.  Side effects, alternatives and risks were discussed with the patient.  PAST MEDICAL HISTORY:  None  PAST SURGICAL HISTORY:   Past Surgical History:  Procedure Laterality Date  . HERNIA REPAIR      SOCIAL HISTORY:   Social History   Tobacco Use  . Smoking status: Never Smoker  . Smokeless tobacco: Never Used  Substance Use Topics  . Alcohol use: Yes    Comment: occassionally    FAMILY HISTORY:  History reviewed. No pertinent  family history.  DRUG ALLERGIES:   Allergies  Allergen Reactions  . Percocet [Oxycodone-Acetaminophen] Hives    REVIEW OF SYSTEMS:   Review of Systems  Constitutional: Negative.  Negative for chills, fever and malaise/fatigue.  HENT: Negative.  Negative for ear discharge, ear pain, hearing loss, nosebleeds and sore throat.   Eyes: Negative.  Negative for blurred vision and pain.  Respiratory: Negative.  Negative for cough, hemoptysis, shortness of breath and wheezing.   Cardiovascular: Positive for chest pain. Negative for palpitations and leg swelling.  Gastrointestinal: Negative.  Negative for abdominal pain, blood in stool, diarrhea, nausea and vomiting.  Genitourinary: Negative.  Negative for dysuria.  Musculoskeletal: Negative.  Negative for back pain.  Skin: Negative.   Neurological: Negative for dizziness, tremors, speech change, focal weakness, seizures and headaches.  Endo/Heme/Allergies: Negative.  Does not bruise/bleed easily.  Psychiatric/Behavioral: Negative.  Negative for depression, hallucinations and suicidal ideas.    MEDICATIONS AT HOME:   Prior to Admission medications   Medication Sig Start Date End Date Taking? Authorizing Provider  losartan (COZAAR) 25 MG tablet Take 25 mg by mouth daily.   Yes [provider]  EPINEPHrine 0.3 mg/0.3 mL IJ SOAJ injection Inject 0.3 mLs (0.3 mg total) into the muscle once. Patient not taking: Reported on 11/01/2017 09/15/15   Nita Sickle, MD  famotidine (PEPCID) 20 MG tablet Take 1 tablet (20 mg total) by mouth  2 (two) times daily. Patient not taking: Reported on 11/01/2017 09/15/15   Nita SickleVeronese, [place], MD      VITAL SIGNS:  Blood pressure (!) 139/99, pulse 71, temperature 98 F (36.7 C), temperature source Oral, resp. rate 18, height 5\' 10"  (1.778 m), weight 93.4 kg, SpO2 100 %.  PHYSICAL EXAMINATION:   Physical Exam  Constitutional: He is oriented to person, place, and time. No distress.  HENT:  Head:  Normocephalic.  Eyes: No scleral icterus.  Neck: Normal range of motion. Neck supple. No JVD present. No tracheal deviation present.  Cardiovascular: Normal rate, regular rhythm and normal heart sounds. Exam reveals no gallop and no friction rub.  No murmur heard. Pulmonary/Chest: Effort normal and breath sounds normal. No respiratory distress. He has no wheezes. He has no rales. He exhibits no tenderness.  Abdominal: Soft. Bowel sounds are normal. He exhibits no distension and no mass. There is no tenderness. There is no rebound and no guarding.  Musculoskeletal: Normal range of motion. He exhibits no edema.  Neurological: He is alert and oriented to person, place, and time.  Skin: Skin is warm. No rash noted. No erythema.  Psychiatric: Judgment normal.      LABORATORY PANEL:   CBC Recent Labs  Lab 11/01/17 1522  WBC 6.2  HGB 16.4  HCT 48.4  PLT 207   ------------------------------------------------------------------------------------------------------------------  Chemistries  Recent Labs  Lab 11/01/17 1522  NA 136  K 3.7  CL 103  CO2 26  GLUCOSE 99  BUN 14  CREATININE 0.71  CALCIUM 9.4   ------------------------------------------------------------------------------------------------------------------  Cardiac Enzymes Recent Labs  Lab 11/01/17 1522  TROPONINI <0.03   ------------------------------------------------------------------------------------------------------------------  RADIOLOGY:  Dg Chest 2 View  Result Date: 11/01/2017 CLINICAL DATA:  Chest pain. EXAM: CHEST - 2 VIEW COMPARISON:  Radiograph of September 15, 2015. FINDINGS: The heart size and mediastinal contours are within normal limits. Both lungs are clear. No pneumothorax or pleural effusion is noted. The visualized skeletal structures are unremarkable. IMPRESSION: No active cardiopulmonary disease. Electronically Signed   By: Lupita RaiderJames  Green Jr, M.D.   On: 11/01/2017 15:57    EKG:  Sinus rhythm no  ST elevation or depression  IMPRESSION AND PLAN:   42 year old healthy male who presents with chest pain.  1.  Unstable angina: Patient symptoms concerning for unstable angina. Follow troponins and telemetry monitoring Case discussed with cardiology Plan for cardiac stress test if troponins are negative If troponins are positive patient will need cardiac catheterization N.p.o. after midnight Heparin drip and aspirin started Check lipid panel and A1c  2.  Shortness of breath: Check echocardiogram Plan as outlined above  3.  Hypertension: Continue losartan.    All the records are reviewed and case discussed with ED provider. Management plans discussed with the patient and he is in agreement  CODE STATUS: full  TOTAL TIME TAKING CARE OF THIS PATIENT: 46 minutes.    Shanzay Hepworth M.D on 11/01/2017 at 6:20 PM  Between 7am to 6pm - Pager - [phone]  After 6pm go to [email or web address] - Social research officer, governmentpassword [email or web address]  [facility] [place] [facility]  Office  [phone]  CC: Primary care physician; Jerrilyn CairoM[place], [facility]

## 2017-11-01 NOTE — ED Provider Notes (Signed)
[facility] Emergency Department Provider Note  ____________________________________________   First MD Initiated Contact with Patient 11/01/17 1735     (approximate)  I have reviewed the triage vital signs and the nursing notes.   HISTORY  Chief Complaint Chest Pain and Near Syncope    HPI Curtis SkillCarl K Enyeart Jr. is a 42 y.o. 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  [facility] further evaluation of chest pain.  He reports that for the last couple of months he is felt increasingly dyspneic on exertion even with relatively small exertions like walking up a flight of stairs.  This is gotten worse significantly over the last couple of weeks.  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  He was started on hydrochlorothiazide for blood pressure control and [facility].  He was at the appointment with the cardiologist today when he suddenly developed a severe 8 out of 10 sharp and pressure-like chest pain in the center of his chest.  He almost passed out and reports that he had numbness in his extremities.  Some mild shortness of breath as well.  The symptoms lasted about 15 minutes and have almost resolved but he still feels some residual discomfort in his chest.  He denies any recent illnesses and denies fever/chills, nausea, vomiting, abdominal pain, and dysuria.  Other than the blood pressure medicine (according to the record it is losartan and not HCTZ), he does not take any regular medications.  He takes no aspirin.  He has no history of diabetes, hypercholesterolemia, tobacco use.  His mother had a heart attack but no other first-degree relatives have had one.  He has had no unilateral leg swelling, no history of  blood clots in the legs or lungs, no recent surgeries or immobilizations, and he stays relatively active for his job.  He has never had a cardiac work-up.  History reviewed. No pertinent past medical history.  Patient Active Problem List   Diagnosis Date Noted  . Unstable angina (HCC) 11/01/2017    Past Surgical History:  Procedure Laterality Date  . HERNIA REPAIR      Prior to Admission medications   Medication Sig Start Date End Date Taking? Authorizing Provider  losartan (COZAAR) 25 MG tablet Take 25 mg by mouth daily.   Yes [provider]  EPINEPHrine 0.3 mg/0.3 mL IJ SOAJ injection Inject 0.3 mLs (0.3 mg total) into the muscle once. Patient not taking: Reported on 11/01/2017 09/15/15   Nita SickleVeronese, [place], MD  famotidine (PEPCID) 20 MG tablet Take 1 tablet (20 mg total) by mouth 2 (two) times daily. Patient not taking: Reported on 11/01/2017 09/15/15   Nita SickleVeronese, [place], MD    Allergies Percocet [oxycodone-acetaminophen]  History reviewed. No pertinent family history.  Social History Social History   Tobacco Use  . Smoking status: Never Smoker  . Smokeless tobacco: Never Used  Substance Use Topics  . Alcohol use: Yes    Comment: occassionally  . Drug use: No    Review of Systems Constitutional: No fever/chills Eyes: No visual changes. ENT: No sore throat. Cardiovascular: Chest pain as described above.  With associated near syncope. Respiratory: Episodic dyspnea on exertion getting worse over the last couple months, much worse recently Gastrointestinal: No abdominal pain.  No nausea, no vomiting.  No diarrhea.  No constipation. Genitourinary: Negative for dysuria. Musculoskeletal: Negative for neck pain.  Negative for back pain. Integumentary: Negative for rash. Neurological: Negative for headaches, focal weakness or numbness.   ____________________________________________   PHYSICAL EXAM:  VITAL SIGNS: ED Triage Vitals  Enc Vitals Group     BP  11/01/17 1518 (!) 156/108     Pulse Rate 11/01/17 1518 87     Resp 11/01/17 1518 16     Temp 11/01/17 1518 98 F (36.7 C)     Temp Source 11/01/17 1518 Oral     SpO2 11/01/17 1518 96 %     Weight 11/01/17 1519 93.4 kg (206 lb)     Height 11/01/17 1519 1.778 m (5\' 10" )     Head Circumference --      Peak Flow --      Pain Score 11/01/17 1519 2     Pain Loc --      Pain Edu? --      Excl. in GC? --     Constitutional: Alert and oriented.  Pale, no acute distress at this time but appears uncomfortable Eyes: Conjunctivae are normal.  Head: Atraumatic. Nose: No congestion/rhinnorhea. Mouth/Throat: Mucous membranes are moist. Neck: No stridor.  No meningeal signs.   Cardiovascular: Normal rate, regular rhythm. Good peripheral circulation. Grossly normal heart sounds. Respiratory: Normal respiratory effort.  No retractions. Lungs CTAB. Gastrointestinal: Soft and nontender. No distention.  Musculoskeletal: No lower extremity tenderness nor edema. No gross deformities of extremities. Neurologic:  Normal speech and language. No gross focal neurologic deficits are appreciated.  Skin:  Skin is pale, warm, dry and intact. No rash noted. Psychiatric: Mood and affect are normal. Speech and behavior are normal.  ____________________________________________   LABS (all labs ordered are listed, but only abnormal results are displayed)  Labs Reviewed  BASIC METABOLIC PANEL  CBC  TROPONIN I  PROTIME-INR  HEPATIC FUNCTION PANEL  LIPASE, BLOOD  APTT  HEPARIN LEVEL (UNFRACTIONATED)   ____________________________________________  EKG  ED ECG REPORT I, Loleta Rose, the attending physician, personally viewed and interpreted this ECG.  Date: 11/01/2017 EKG Time: 15: 10 Rate: 88 Rhythm: normal sinus rhythm QRS Axis: normal Intervals: normal ST/T Wave abnormalities: normal Narrative Interpretation: no evidence of acute  ischemia  ____________________________________________  RADIOLOGY I, Loleta Rose, personally viewed and evaluated these images (plain radiographs) as part of my medical decision making, as well as reviewing the written report by the radiologist.  ED MD interpretation: No acute intrathoracic abnormalities on chest x-ray  Official radiology report(s): Dg Chest 2 View  Result Date: 11/01/2017 CLINICAL DATA:  Chest pain. EXAM: CHEST - 2 VIEW COMPARISON:  Radiograph of September 15, 2015. FINDINGS: The heart size and mediastinal contours are within normal limits. Both lungs are clear. No pneumothorax or pleural effusion is noted. The visualized skeletal structures are unremarkable. IMPRESSION: No active cardiopulmonary disease. Electronically Signed   By: Lupita Raider, M.D.   On: 11/01/2017 15:57    ____________________________________________   PROCEDURES  Critical Care performed: Yes, see critical care procedure note(s)   Procedure(s) performed:   .Critical Care Performed by: Loleta Rose, MD Authorized by: Loleta Rose, MD   Critical care provider statement:    Critical care time (minutes):  30   Critical care time was exclusive of:  Separately billable procedures and treating other patients   Critical care was necessary to treat or prevent imminent or life-threatening deterioration of the following conditions: unstable angina.  Critical care was time spent personally by me on the following activities:  Development of treatment plan with patient or surrogate, discussions with consultants, evaluation of patient's response to treatment, examination of patient, obtaining history from patient or surrogate, ordering and performing treatments and interventions, ordering and review of laboratory studies, ordering and review of radiographic studies, pulse oximetry, re-evaluation of patient's condition and review of old charts     ____________________________________________   INITIAL  IMPRESSION / ASSESSMENT AND PLAN / ED COURSE  As part of my medical decision making, I reviewed the following data within the electronic MEDICAL RECORD NUMBER Nursing notes reviewed and incorporated, Labs reviewed , EKG interpreted , Radiograph reviewed , Discussed with admitting physician  and Notes from prior ED visits    Differential diagnosis includes, but is not limited to, unstable angina/ACS, PE, pneumothorax, acute heart failure, pneumonia, less likely GI related issues such as acid reflux.  By far the symptoms sound almost classic for ACS, and since his troponin is negative and he has no EKG changes this would qualify as unstable angina.  Fortunately his lab work is reassuring today with a normal troponin, normal CBC, normal basic metabolic panel.  I have added on hepatic function tests and coagulation studies and lipase, but based on the description of his symptoms he needs to be admitted for further evaluation including serial troponins and cardiology evaluation, possibly stress test versus catheterization.  I discussed this with the patient and he understands and agrees.  I am giving a full dose aspirin as well as starting him on heparin with a bolus and infusion as per protocol.  I have discussed the case with Dr. Juliene Pina with the hospitalist service who will admit.    ____________________________________________  FINAL CLINICAL IMPRESSION(S) / ED DIAGNOSES  Final diagnoses:  Unstable angina (HCC)     MEDICATIONS GIVEN DURING THIS VISIT:  Medications  heparin ADULT infusion 100 units/mL (25000 units/269mL sodium chloride 0.45%) (has no administration in time range)  heparin bolus via infusion 4,000 Units (has no administration in time range)  aspirin chewable tablet 324 mg (324 mg Oral Given 11/01/17 1808)     ED Discharge Orders    None       Note:  This document was prepared using Dragon voice recognition software and may include unintentional dictation errors.    Loleta Rose, MD 11/01/17 [phone]

## 2017-11-02 ENCOUNTER — Inpatient Hospital Stay: Payer: Managed Care, Other (non HMO)

## 2017-11-02 LAB — CBC
HCT: 45.2 % (ref 40.0–52.0)
HEMOGLOBIN: 15.5 g/dL (ref 13.0–18.0)
MCH: 29.5 pg (ref 26.0–34.0)
MCHC: 34.3 g/dL (ref 32.0–36.0)
MCV: 86 fL (ref 80.0–100.0)
PLATELETS: 194 10*3/uL (ref 150–440)
RBC: 5.25 MIL/uL (ref 4.40–5.90)
RDW: 13.3 % (ref 11.5–14.5)
WBC: 4.9 10*3/uL (ref 3.8–10.6)

## 2017-11-02 LAB — ECHOCARDIOGRAM COMPLETE
HEIGHTINCHES: 70 in
Weight: 3296 oz

## 2017-11-02 LAB — BASIC METABOLIC PANEL
Anion gap: 8 (ref 5–15)
BUN: 15 mg/dL (ref 6–20)
CHLORIDE: 105 mmol/L (ref 98–111)
CO2: 28 mmol/L (ref 22–32)
Calcium: 9.1 mg/dL (ref 8.9–10.3)
Creatinine, Ser: 0.79 mg/dL (ref 0.61–1.24)
Glucose, Bld: 106 mg/dL — ABNORMAL HIGH (ref 70–99)
Potassium: 3.6 mmol/L (ref 3.5–5.1)
Sodium: 141 mmol/L (ref 135–145)

## 2017-11-02 LAB — NM MYOCAR MULTI W/SPECT W/WALL MOTION / EF
CHL CUP NUCLEAR SDS: 0
CHL CUP NUCLEAR SSS: 0
LV sys vol: 21 mL
LVDIAVOL: 68 mL (ref 62–150)
SRS: 5
TID: 0.71

## 2017-11-02 LAB — TROPONIN I: Troponin I: <0.03 ng/mL (ref <0.03)

## 2017-11-02 LAB — HEPARIN LEVEL (UNFRACTIONATED)
HEPARIN UNFRACTIONATED: 0.55 [IU]/mL (ref 0.30–0.70)
Heparin Unfractionated: 0.5 IU/mL (ref 0.30–0.70)

## 2017-11-02 MED ORDER — TECHNETIUM TC 99M TETROFOSMIN IV KIT
13.3900 | PACK | Freq: Once | INTRAVENOUS | Status: AC | PRN
  Administered 2017-11-02: 13.39 via INTRAVENOUS

## 2017-11-02 MED ORDER — TECHNETIUM TC 99M TETROFOSMIN IV KIT
31.4000 | PACK | Freq: Once | INTRAVENOUS | Status: AC | PRN
  Administered 2017-11-02: 31.4 via INTRAVENOUS

## 2017-11-02 NOTE — Consult Note (Signed)
Cardiology Consultation Note    Patient ID: Star Cheese., MRN: [id number], DOB/AGE: 1975-08-01 43 y.o. Admit date: 11/01/2017   Date of Consult: 11/02/2017 Primary Physician: [facility] Primary Cardiologist: Dr. Darrold Junker Chief Complaint: chest pain Reason for Consultation: chest pain Requesting MD: Dr. Tobi Bastos  HPI: Curtis Brown. is a 42 y.o. male with history of hypertension treated with losartan but no other medical problems.  He has been having some dyspnea on exertion and chest tightness.  He presented to an outpatient office where he developed severe chest pain associated with extreme dyspnea and hyperventilation.  He was sent to the emergency room where electric cardiogram showed no ischemia.  He ruled out for myocardial infarction.  Risk factors include hypertension.  He denies tobacco use.  He denies hyperlipidemia or diabetes.  He remains fairly active but recently has had dyspnea on exertion.  History reviewed. No pertinent past medical history.    Surgical History:  Past Surgical History:  Procedure Laterality Date  . HERNIA REPAIR       Home Meds: Prior to Admission medications   Medication Sig Start Date End Date Taking? Authorizing Provider  losartan (COZAAR) 25 MG tablet Take 25 mg by mouth daily.   Yes [provider]  EPINEPHrine 0.3 mg/0.3 mL IJ SOAJ injection Inject 0.3 mLs (0.3 mg total) into the muscle once. Patient not taking: Reported on 11/01/2017 09/15/15   Nita Sickle, MD  famotidine (PEPCID) 20 MG tablet Take 1 tablet (20 mg total) by mouth 2 (two) times daily. Patient not taking: Reported on 11/01/2017 09/15/15   Nita Sickle, MD    Inpatient Medications:  . aspirin  81 mg Oral Daily  . losartan  25 mg Oral Daily  . nitroGLYCERIN  0.5 inch Topical Q6H   . sodium chloride 100 mL/hr at 11/02/17 0106    Allergies:  Allergies  Allergen Reactions  . Percocet [Oxycodone-Acetaminophen] Hives    Social History    Socioeconomic History  . Marital status: Single    Spouse name: Not on file  . Number of children: Not on file  . Years of education: Not on file  . Highest education level: Not on file  Occupational History  . Not on file  Social Needs  . Financial resource strain: Not on file  . Food insecurity:    Worry: Not on file    Inability: Not on file  . Transportation needs:    Medical: Not on file    Non-medical: Not on file  Tobacco Use  . Smoking status: Never Smoker  . Smokeless tobacco: Never Used  Substance and Sexual Activity  . Alcohol use: Yes    Comment: occassionally  . Drug use: No  . Sexual activity: Not on file  Lifestyle  . Physical activity:    Days per week: Not on file    Minutes per session: Not on file  . Stress: Not on file  Relationships  . Social connections:    Talks on phone: Not on file    Gets together: Not on file    Attends religious service: Not on file    Active member of club or organization: Not on file    Attends meetings of clubs or organizations: Not on file    Relationship status: Not on file  . Intimate partner violence:    Fear of current or ex partner: Not on file    Emotionally abused: Not on file  Physically abused: Not on file    Forced sexual activity: Not on file  Other Topics Concern  . Not on file  Social History Narrative  . Not on file     History reviewed. No pertinent family history.   Review of Systems: A 12-system review of systems was performed and is negative except as noted in the HPI.  Labs: Recent Labs    11/01/17 1522 11/01/17 2015 11/02/17 0144  TROPONINI <0.03 <0.03 <0.03   Lab Results  Component Value Date   WBC 6.2 11/01/2017   HGB 16.4 11/01/2017   HCT 48.4 11/01/2017   MCV 85.9 11/01/2017   PLT 207 11/01/2017    Recent Labs  Lab 11/01/17 1522 11/01/17 1804  NA 136  --   K 3.7  --   CL 103  --   CO2 26  --   BUN 14  --   CREATININE 0.71  --   CALCIUM 9.4  --   PROT  --  7.7   BILITOT  --  0.8  ALKPHOS  --  72  ALT  --  30  AST  --  26  GLUCOSE 99  --    Lab Results  Component Value Date   CHOL 150 11/01/2017   HDL 34 (L) 11/01/2017   LDLCALC 96 11/01/2017   TRIG 98 11/01/2017   No results found for: DDIMER  Radiology/Studies:  Dg Chest 2 View  Result Date: 11/01/2017 CLINICAL DATA:  Chest pain. EXAM: CHEST - 2 VIEW COMPARISON:  Radiograph of September 15, 2015. FINDINGS: The heart size and mediastinal contours are within normal limits. Both lungs are clear. No pneumothorax or pleural effusion is noted. The visualized skeletal structures are unremarkable. IMPRESSION: No active cardiopulmonary disease. Electronically Signed   By: Lupita Raider, M.D.   On: 11/01/2017 15:57    Wt Readings from Last 3 Encounters:  11/01/17 93.4 kg  09/15/15 88.9 kg  05/10/15 90.7 kg    EKG: Normal sinus rhythm with no ischemia  Physical Exam:  Blood pressure 119/79, pulse 85, temperature 98.1 F (36.7 C), temperature source Oral, resp. rate 18, height 5\' 10"  (1.778 m), weight 93.4 kg, SpO2 97 %. Body mass index is 29.56 kg/m. General: Well developed, well nourished, in no acute distress. Head: Normocephalic, atraumatic, sclera non-icteric, no xanthomas, nares are without discharge.  Neck: Negative for carotid bruits. JVD not elevated. Lungs: Clear bilaterally to auscultation without wheezes, rales, or rhonchi. Breathing is unlabored. Heart: RRR with S1 S2. No murmurs, rubs, or gallops appreciated. Abdomen: Soft, non-tender, non-distended with normoactive bowel sounds. No hepatomegaly. No rebound/guarding. No obvious abdominal masses. Msk:  Strength and tone appear normal for age. Extremities: No clubbing or cyanosis. No edema.  Distal pedal pulses are 2+ and equal bilaterally. Neuro: Alert and oriented X 3. No facial asymmetry. No focal deficit. Moves all extremities spontaneously. Psych:  Responds to questions appropriately with a normal affect.     Assessment and  Plan  42 year old male with no prior history of coronary disease but history of hypertension who was admitted after developing severe chest pain with shortness of breath.  He ruled out for myocardial infarction.  Chest x-ray was normal with normal mediastinum.  Distal pulses are equal bilaterally.  EKG showed no ischemia.  Cardiac markers were negative for any abnormality.  Electrolytes were normal.  Patient will be evaluated with a functional study.  If negative would discharge with continued use of losartan.  Echo showed normal LV  function with mild LVH consistent with hypertension.  Signed, Dalia HeadingKenneth A Nakoma Gotwalt MD 11/02/2017, 10:07 AM Pager: [phone]

## 2017-11-02 NOTE — Discharge Summary (Signed)
[facility] [place] [facility]   PATIENT NAME: Curtis Brown    MR#:  [id number]  DATE OF BIRTH:  03/30/1975  DATE OF ADMISSION:  11/01/2017 ADMITTING PHYSICIAN: Adrian SaranSital Mody, MD  DATE OF DISCHARGE: No discharge date for patient encounter.  PRIMARY CARE PHYSICIAN: [place]   ADMISSION DIAGNOSIS:  Unstable angina (HCC) [I20.0] Hypertension DISCHARGE DIAGNOSIS:  Active Problems:   Unstable angina (HCC) Hypertension  SECONDARY DIAGNOSIS:  History reviewed. No pertinent past medical history.   ADMITTING HISTORY Curtis Brown  is a 42 y.o. male with no past medical history who presents today to the emergency room due to chest pain.  Patient reports over the past month he has had increasing shortness of breath when walking up a flight of steps.  However he has not had chest pain.  2 days ago he had his first episode of substernal chest pain with radiation to his left arm associated with shortness of breath and nausea.  He has had a few episodes of chest pain that time.  No relieving or aggravating factors.  He also states that his blood pressure was elevated which was recently diagnosed and he was started on hypertensive medication.  [facility] due to chest pain by his primary care physician. Patient went to his appointment at the cardiologist office when he developed sudden onset of 9 out of 10 substernal chest pain with radiation to his left arm and numbness to his left arm.  He developed shortness of breath as well.  He was sent to the ER for further evaluation. In the emergency room his first set of troponins are negative.  He is currently chest pain-free.  He received aspirin and has been started on heparin drip.  Side effects, alternatives and risks were discussed with the patient.  HOSPITAL COURSE:  Patient was admitted to telemetry.  Serial troponins were all completely negative.  Patient was anticoagulated with heparin  drip and received aspirin and blood pressure medications during hospitalization.  He was worked up with echocardiogram which showed left ventricle hypertrophy but EF of 60 to 65%.  He was worked up with cardiac stress test which showed no ischemia.  Treadmill test showed no evidence of exercise-induced arrhythmia or ischemia.  Sestamibi images revealed normal left ventricular function with no reversible ischemia.  Patient will be discharged home follow-up with cardiology in the clinic.  CONSULTS OBTAINED:  Treatment Team:  Marcina MillardParaschos, Alexander, MD Dalia HeadingFath, Kenneth A, MD  DRUG ALLERGIES:   Allergies  Allergen Reactions  . Percocet [Oxycodone-Acetaminophen] Hives    DISCHARGE MEDICATIONS:   Allergies as of 11/02/2017      Reactions   Percocet [oxycodone-acetaminophen] Hives      Medication List    STOP taking these medications   EPINEPHrine 0.3 mg/0.3 mL Soaj injection Commonly known as:  EPI-PEN   famotidine 20 MG tablet Commonly known as:  PEPCID     TAKE these medications   losartan 25 MG tablet Commonly known as:  COZAAR Take 25 mg by mouth daily.       Today  Patient seen and evaluated today No chest pain No shortness of breath Tolerating diet well  VITAL SIGNS:  Blood pressure 119/79, pulse 85, temperature 98.1 F (36.7 C), temperature source Oral, resp. rate 18, height 5\' 10"  (1.778 m), weight 93.4 kg, SpO2 97 %.  I/O:    Intake/Output Summary (Last 24 hours) at 11/02/2017 1244 Last data filed at 11/02/2017  0500 Gross per 24 hour  Intake 620 ml  Output -  Net 620 ml    PHYSICAL EXAMINATION:  Physical Exam  GENERAL:  42 y.o.-year-old patient lying in the bed with no acute distress.  LUNGS: Normal breath sounds bilaterally, no wheezing, rales,rhonchi or crepitation. No use of accessory muscles of respiration.  CARDIOVASCULAR: S1, S2 normal. No murmurs, rubs, or gallops.  ABDOMEN: Soft, non-tender, non-distended. Bowel sounds present. No organomegaly or  mass.  NEUROLOGIC: Moves all 4 extremities. PSYCHIATRIC: The patient is alert and oriented x 3.  SKIN: No obvious rash, lesion, or ulcer.   DATA REVIEW:   CBC Recent Labs  Lab 11/02/17 1051  WBC 4.9  HGB 15.5  HCT 45.2  PLT 194    Chemistries  Recent Labs  Lab 11/01/17 1804 11/02/17 1051  NA  --  141  K  --  3.6  CL  --  105  CO2  --  28  GLUCOSE  --  106*  BUN  --  15  CREATININE  --  0.79  CALCIUM  --  9.1  AST 26  --   ALT 30  --   ALKPHOS 72  --   BILITOT 0.8  --     Cardiac Enzymes Recent Labs  Lab 11/02/17 1051  TROPONINI <0.03    Microbiology Results  No results found for this or any previous visit.  RADIOLOGY:  Dg Chest 2 View  Result Date: 11/01/2017 CLINICAL DATA:  Chest pain. EXAM: CHEST - 2 VIEW COMPARISON:  Radiograph of September 15, 2015. FINDINGS: The heart size and mediastinal contours are within normal limits. Both lungs are clear. No pneumothorax or pleural effusion is noted. The visualized skeletal structures are unremarkable. IMPRESSION: No active cardiopulmonary disease. Electronically Signed   By: Lupita Raider, M.D.   On: 11/01/2017 15:57   Nm Myocar Multi W/spect W/wall Motion / Ef  Result Date: 11/02/2017  The study is normal.  The left ventricular ejection fraction is normal (55-65%).  There was no ST segment deviation noted during stress.  Normal lv function Normal stress ekg Low risk study    Follow up with PCP in 1 week.  Management plans discussed with the patient, family and they are in agreement.  CODE STATUS: Full code    Code Status Orders  (From admission, onward)         Start     Ordered   11/01/17 1952  Full code  Continuous     11/01/17 1951        Code Status History    This patient has a current code status but no historical code status.      TOTAL TIME TAKING CARE OF THIS PATIENT ON DAY OF DISCHARGE: more than 35 minutes.   Ihor Austin M.D on 11/02/2017 at 12:44 PM  Between 7am to 6pm -  Pager - [phone]  After 6pm go to [email or web address] - password [email or web address]  [facility]  Office  [phone]  CC: Primary care physician; [facility]  Note: This dictation was prepared with Dragon dictation along with smaller phrase technology. Any transcriptional errors that result from this process are unintentional.

## 2017-11-02 NOTE — Progress Notes (Signed)
Advanced care plan. Purpose of the Encounter: CODE STATUS Parties in Attendance:Patient Patient's Decision Capacity: Good Subjective/Patient's story: Presented to the emergency room for chest pain Objective/Medical story Patient will be admitted for cardiac work-up which includes echocardiogram stress test and troponins GOALS OF CARE Advance care directives, goals of care discussed with patient and family They want everything done which includes CPR, intubation ventilator if the need arises Patient was explained regarding management plan and work-up which includes echocardiogram and cardiac stress test CODE STATUS: Full code Time spent discussing advanced care planning: 16 minutes

## 2017-11-02 NOTE — Progress Notes (Signed)
Patient underwent an ETT treadmill test.  Did well on the treadmill with no evidence of exercise-induced arrhythmia or ischemia.  Sestamibi images revealed normal LV function with no reversible ischemia.  Patient's symptoms do not appear to be secondary to ischemic or structural heart disease.  Echocardiogram showed LVH with preserved LV function and no significant valvular abnormalities.  Would ambulate and consider discharge with outpatient follow-up with Dr. Darrold JunkerParaschos.

## 2017-11-02 NOTE — Progress Notes (Signed)
Discharge instructions explained to pt/ verbalized an understanding/ iv and tele removed/ work note provided/ refused wheelchair/ ambulated off unit with family

## 2017-11-02 NOTE — Consult Note (Signed)
ANTICOAGULATION CONSULT NOTE - Initial Consult  Pharmacy Consult for heparin Indication: ACS/unstable angina  Allergies  Allergen Reactions  . Percocet [Oxycodone-Acetaminophen] Hives    Patient Measurements: Height: 5\' 10"  (177.8 cm) Weight: 206 lb (93.4 kg) IBW/kg (Calculated) : 73 Heparin Dosing Weight: 91.9 kg  Vital Signs: Temp: 97.6 F (36.4 C) (08/29 0607) Temp Source: Oral (08/29 0607) BP: 108/72 (08/29 0607) Pulse Rate: 62 (08/29 0607)  Labs: Recent Labs    11/01/17 1522 11/01/17 1804 11/01/17 2015 11/02/17 0004 11/02/17 0144 11/02/17 0556  HGB 16.4  --   --   --   --   --   HCT 48.4  --   --   --   --   --   PLT 207  --   --   --   --   --   APTT  --  32  --   --   --   --   LABPROT  --  12.5  --   --   --   --   INR  --  0.94  --   --   --   --   HEPARINUNFRC  --   --   --  0.55  --  0.50  CREATININE 0.71  --   --   --   --   --   TROPONINI <0.03  --  <0.03  --  <0.03  --     Estimated Creatinine Clearance: 135.3 mL/min (by C-G formula based on SCr of 0.71 mg/dL).   Medical History: History reviewed. No pertinent past medical history.  Medications:  Infusions:  . sodium chloride 100 mL/hr at 11/02/17 0106  . heparin 1,200 Units/hr (11/01/17 1821)    Assessment: 42 yom cc CP while climbing stairs at work. [facility] but worsening symptoms at clinic prompted ED eval. Initian troponin <0.03 x 1. EKG with possible left atrial enlargement but normal sinus rhythm. Pharmacy consulted to dose heparin for unstable angina. PTA list is not complete - will follow up.   8/29 @ 0004 HL 0.55. Level is therapeutic x 1   Goal of Therapy:  Heparin level 0.3-0.7 units/ml Monitor platelets by anticoagulation protocol: Yes   Plan:  8/29 @ 0556 HL 0.50. Level is therapeutic x 2. Will continue current infusion rate of heparin 1200 units/hr.  Will check HL and CBC with AM labs per protocol.     Gardner CandleSheema M Leslee Suire, PharmD, BCPS Clinical  Pharmacist 11/02/2017 6:35 AM

## 2017-11-02 NOTE — Consult Note (Signed)
ANTICOAGULATION CONSULT NOTE - Initial Consult  Pharmacy Consult for heparin Indication: ACS/unstable angina  Allergies  Allergen Reactions  . Percocet [Oxycodone-Acetaminophen] Hives    Patient Measurements: Height: 5\' 10"  (177.8 cm) Weight: 206 lb (93.4 kg) IBW/kg (Calculated) : 73 Heparin Dosing Weight: 91.9 kg  Vital Signs: Temp: 98 F (36.7 C) (08/28 1949) Temp Source: Oral (08/28 1949) BP: 139/94 (08/28 1949) Pulse Rate: 76 (08/28 1949)  Labs: Recent Labs    11/01/17 1522 11/01/17 1804 11/01/17 2015 11/02/17 0004  HGB 16.4  --   --   --   HCT 48.4  --   --   --   PLT 207  --   --   --   APTT  --  32  --   --   LABPROT  --  12.5  --   --   INR  --  0.94  --   --   HEPARINUNFRC  --   --   --  0.55  CREATININE 0.71  --   --   --   TROPONINI <0.03  --  <0.03  --     Estimated Creatinine Clearance: 135.3 mL/min (by C-G formula based on SCr of 0.71 mg/dL).   Medical History: History reviewed. No pertinent past medical history.  Medications:  Infusions:  . sodium chloride    . heparin 1,200 Units/hr (11/01/17 1821)    Assessment: 42 yom cc CP while climbing stairs at work. [facility] but worsening symptoms at clinic prompted ED eval. Initian troponin <0.03 x 1. EKG with possible left atrial enlargement but normal sinus rhythm. Pharmacy consulted to dose heparin for unstable angina. PTA list is not complete - will follow up.   Goal of Therapy:  Heparin level 0.3-0.7 units/ml Monitor platelets by anticoagulation protocol: Yes   Plan:  8/29 @ 0004 HL 0.55. Level is therapeutic x 1 dose. Will continue current infusion rate of heparin 1200 units/hr. Will check confirmatory HL level in 6 hours. CBC with AM labs per protocol.      Gardner CandleSheema M Emari Demmer, PharmD, BCPS Clinical Pharmacist 11/02/2017 12:44 AM

## 2017-11-03 LAB — HIV ANTIBODY (ROUTINE TESTING W REFLEX): HIV Screen 4th Generation wRfx: NONREACTIVE

## 2019-08-21 ENCOUNTER — Other Ambulatory Visit: Payer: Self-pay

## 2019-08-21 ENCOUNTER — Emergency Department: Payer: 59

## 2019-08-21 ENCOUNTER — Emergency Department
Admission: EM | Admit: 2019-08-21 | Discharge: 2019-08-21 | Disposition: A | Discharge status: Home / Self Care | Payer: 59 | Attending: Student | Admitting: Student

## 2019-08-21 DIAGNOSIS — Z79899 Other long term (current) drug therapy: Secondary | ICD-10-CM | POA: Insufficient documentation

## 2019-08-21 DIAGNOSIS — M545 Low back pain, unspecified: Secondary | ICD-10-CM

## 2019-08-21 DIAGNOSIS — N23 Unspecified renal colic: Secondary | ICD-10-CM | POA: Insufficient documentation

## 2019-08-21 DIAGNOSIS — R109 Unspecified abdominal pain: Secondary | ICD-10-CM | POA: Diagnosis present

## 2019-08-21 HISTORY — DX: Calculus of kidney: N20.0

## 2019-08-21 LAB — CBC WITH DIFFERENTIAL/PLATELET
Abs Immature Granulocytes: 0.02 10*3/uL (ref 0.00–0.07)
Basophils Absolute: 0 10*3/uL (ref 0.0–0.1)
Basophils Relative: 0 %
Eosinophils Absolute: 0.2 10*3/uL (ref 0.0–0.5)
Eosinophils Relative: 3 %
HCT: 42.6 % (ref 39.0–52.0)
Hemoglobin: 14.8 g/dL (ref 13.0–17.0)
Immature Granulocytes: 0 %
Lymphocytes Relative: 33 %
Lymphs Abs: 2.3 10*3/uL (ref 0.7–4.0)
MCH: 29.1 pg (ref 26.0–34.0)
MCHC: 34.7 g/dL (ref 30.0–36.0)
MCV: 83.7 fL (ref 80.0–100.0)
Monocytes Absolute: 0.5 10*3/uL (ref 0.1–1.0)
Monocytes Relative: 8 %
Neutro Abs: 3.8 10*3/uL (ref 1.7–7.7)
Neutrophils Relative %: 56 %
Platelets: 188 10*3/uL (ref 150–400)
RBC: 5.09 MIL/uL (ref 4.22–5.81)
RDW: 12.3 % (ref 11.5–15.5)
WBC: 6.8 10*3/uL (ref 4.0–10.5)
nRBC: 0 % (ref 0.0–0.2)

## 2019-08-21 LAB — URINALYSIS, COMPLETE (UACMP) WITH MICROSCOPIC
Bacteria, UA: NONE SEEN
Bilirubin Urine: NEGATIVE
Glucose, UA: NEGATIVE mg/dL
Ketones, ur: NEGATIVE mg/dL
Leukocytes,Ua: NEGATIVE
Nitrite: NEGATIVE
Protein, ur: NEGATIVE mg/dL
Specific Gravity, Urine: 1.021 (ref 1.005–1.030)
Squamous Epithelial / LPF: NONE SEEN (ref 0–5)
pH: 6 (ref 5.0–8.0)

## 2019-08-21 LAB — COMPREHENSIVE METABOLIC PANEL
ALT: 24 U/L (ref 0–44)
AST: 21 U/L (ref 15–41)
Albumin: 3.9 g/dL (ref 3.5–5.0)
Alkaline Phosphatase: 65 U/L (ref 38–126)
Anion gap: 10 (ref 5–15)
BUN: 21 mg/dL — ABNORMAL HIGH (ref 6–20)
CO2: 24 mmol/L (ref 22–32)
Calcium: 8.6 mg/dL — ABNORMAL LOW (ref 8.9–10.3)
Chloride: 106 mmol/L (ref 98–111)
Creatinine, Ser: 1.08 mg/dL (ref 0.61–1.24)
GFR calc Af Amer: >60 mL/min (ref >60)
GFR calc non Af Amer: >60 mL/min (ref >60)
Glucose, Bld: 129 mg/dL — ABNORMAL HIGH (ref 70–99)
Potassium: 3.2 mmol/L — ABNORMAL LOW (ref 3.5–5.1)
Sodium: 140 mmol/L (ref 135–145)
Total Bilirubin: 0.7 mg/dL (ref 0.3–1.2)
Total Protein: 7 g/dL (ref 6.5–8.1)

## 2019-08-21 LAB — LIPASE, BLOOD: Lipase: 28 U/L (ref 11–51)

## 2019-08-21 IMAGING — CT CT RENAL STONE PROTOCOL
2 of 4 series · 16 of 46 positions shown, 18 images · non-contrast
Comparison: None.

CLINICAL DATA: Left flank pain with kidney stone suspected. History
of hernia repair

EXAM:
CT ABDOMEN AND PELVIS WITHOUT CONTRAST
TECHNIQUE: Multidetector CT imaging of the abdomen and pelvis was performed
following the standard protocol without IV contrast.

[Series 2: stone full standard · axial · 0.79mm/px · z∈[-930,-440]mm · 13 of 108 slices shown, 15 images]
[im 5/108  soft-tissue]
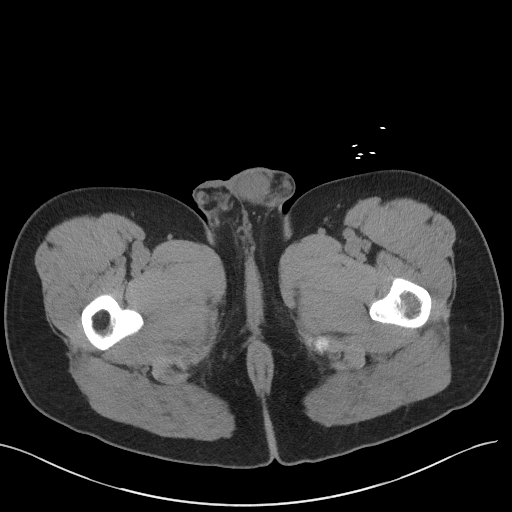
[im 5/108  bone]
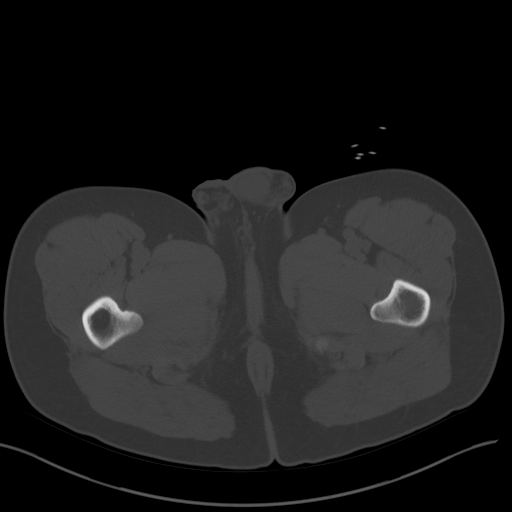
[im 13/108  soft-tissue]
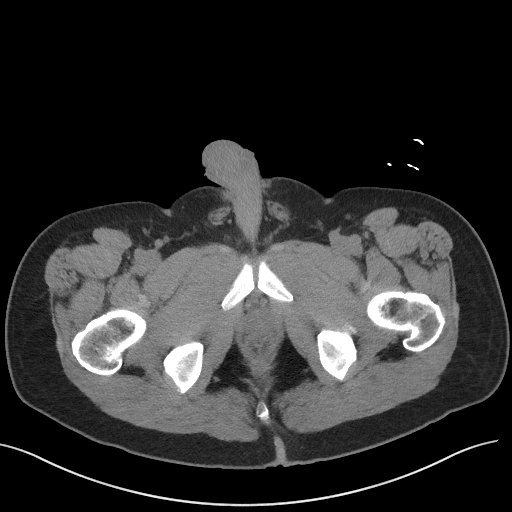
[im 22/108  soft-tissue]
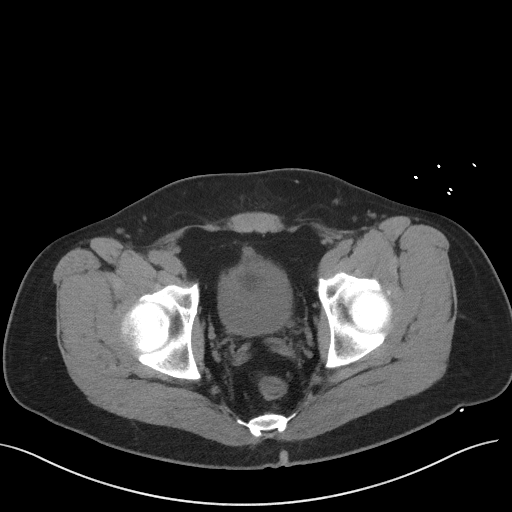
[im 30/108  soft-tissue]
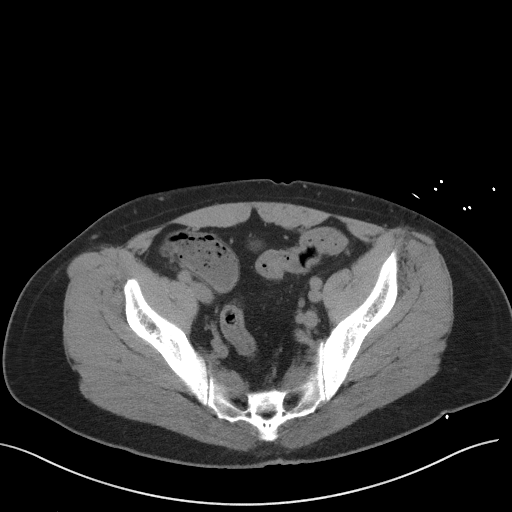
[im 39/108  soft-tissue]
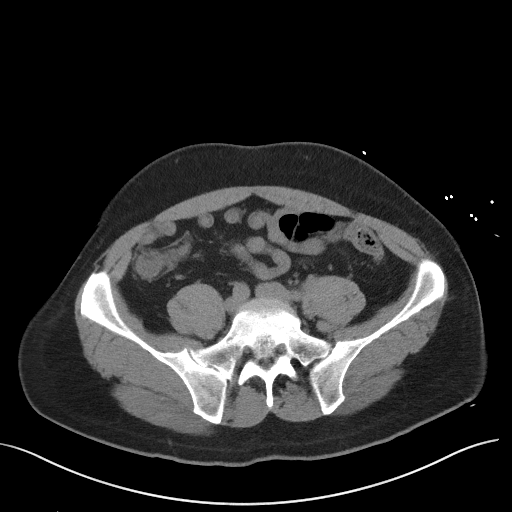
[im 48/108  soft-tissue]
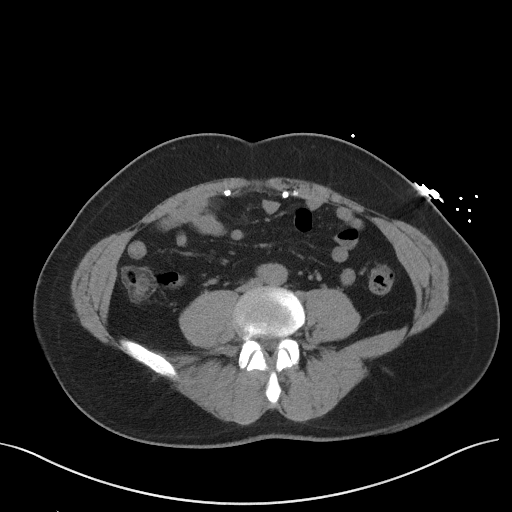
[im 56/108  soft-tissue]
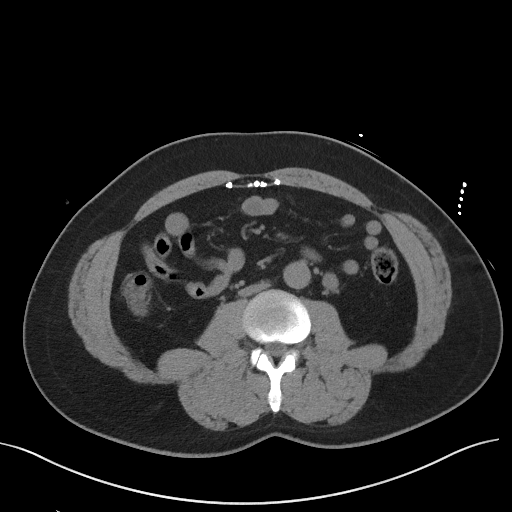
[im 60/108  soft-tissue]
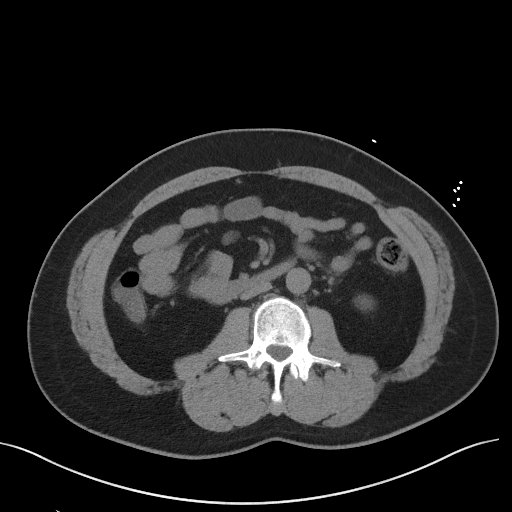
[im 69/108  soft-tissue]
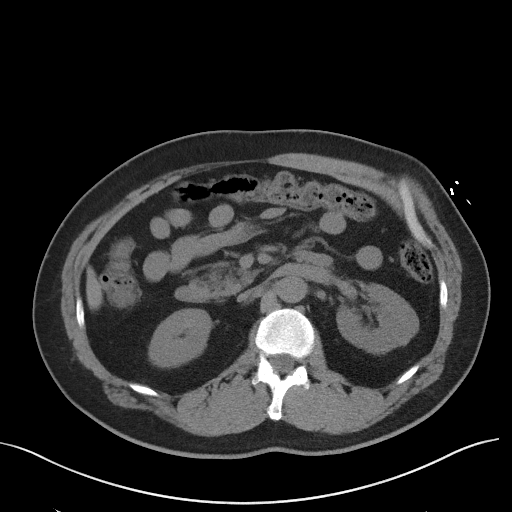
[im 69/108  bone]
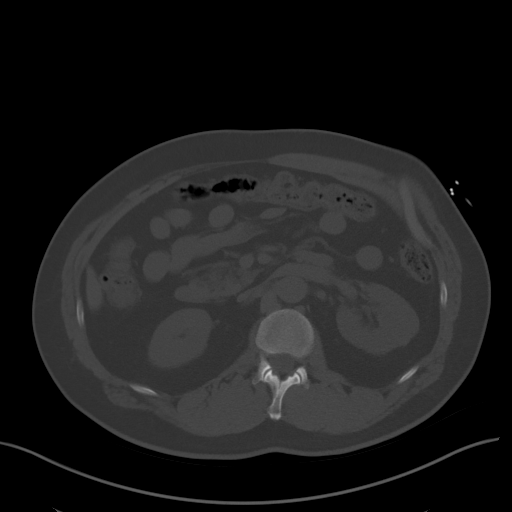
[im 78/108  soft-tissue]
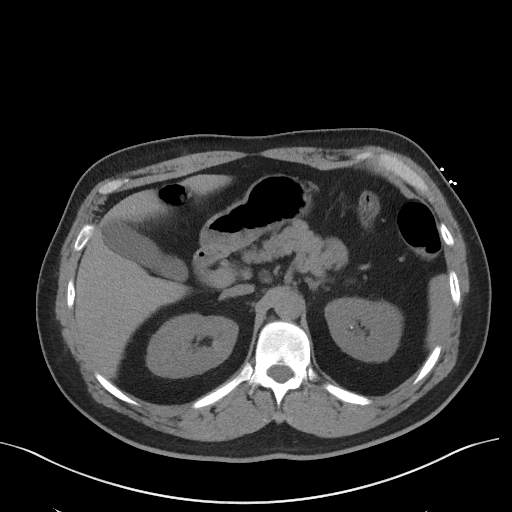
[im 86/108  soft-tissue]
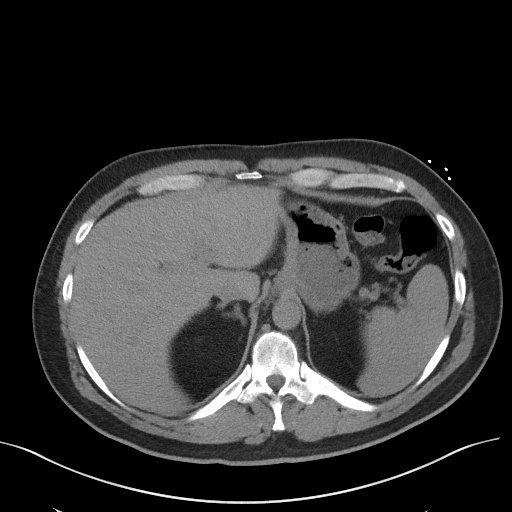
[im 95/108  soft-tissue]
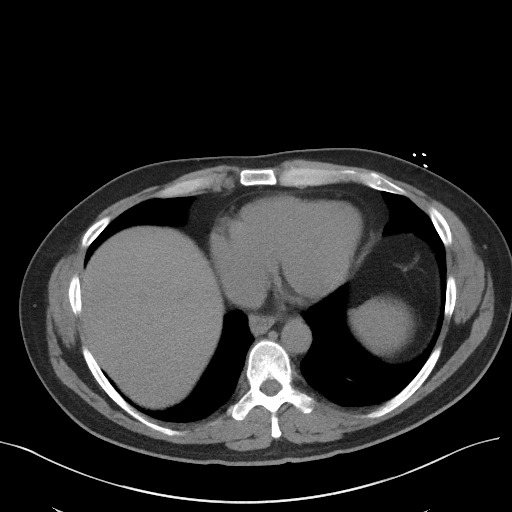
[im 103/108  soft-tissue]
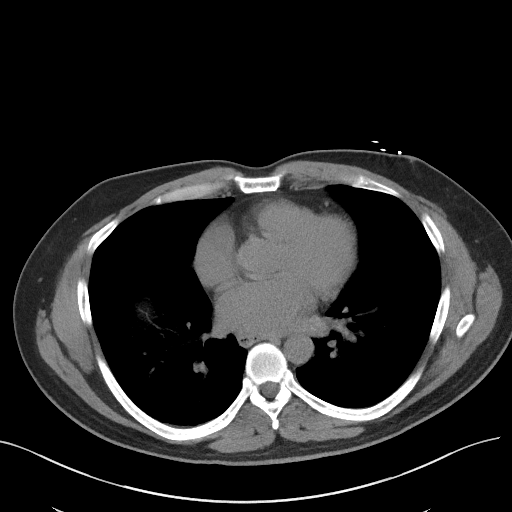

[Series 5: coronal · coronal · 0.75mm/px · 3 of 115 slices shown]
[im 39/115  soft-tissue]
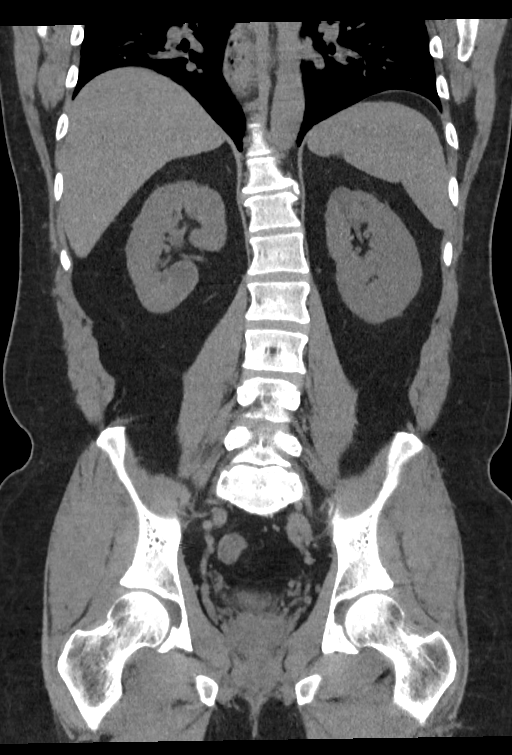
[im 51/115  soft-tissue]
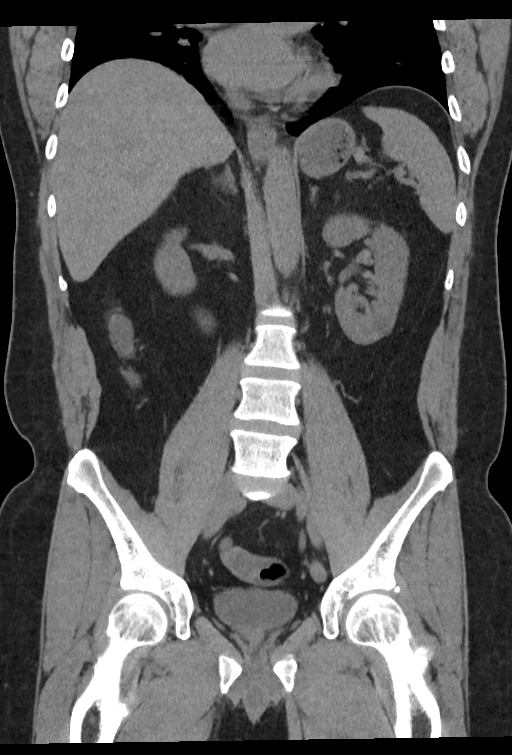
[im 64/115  soft-tissue]
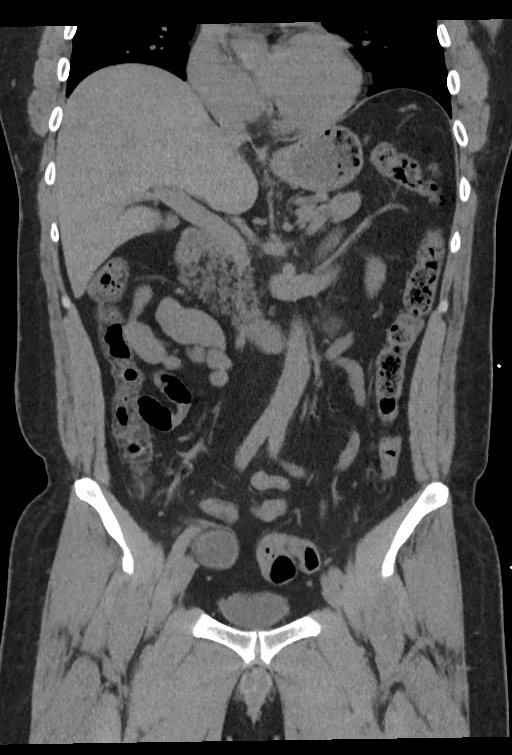

[16 of 46 positions shown; findings below may reference images not displayed]

FINDINGS: Lower chest:  Coronary atherosclerosis

Hepatobiliary: No focal liver abnormality.No evidence of biliary
obstruction or stone.

Pancreas: Unremarkable.

Spleen: Unremarkable.

Adrenals/Urinary Tract: Negative adrenals. Punctate (1-2 mm) stone
at the left UVJ without hydronephrosis. There is a cluster of tiny
renal calculi at the upper pole left kidney. Tiny right renal
calculus on coronal reformats. Bilateral renal cystic densities and
a very dense lesion at the upper pole right kidney measuring 7 mm,
densitometry more consistent with hemorrhagic cyst. There are other
intermediate lesions which are less than 1 cm. Unremarkable bladder.

Stomach/Bowel: No obstruction. The appendix is not visualized. No
pericecal inflammation.

Vascular/Lymphatic: No acute vascular abnormality. No mass or
adenopathy.

Reproductive:No pathologic findings.

Other: No ascites or pneumoperitoneum. Ventral hernia repair using
mesh.

Musculoskeletal: No acute abnormalities.  Mild levoscoliosis
IMPRESSION: 1. Punctate left UVJ calculus.
2. Left more than right small renal calculi.
3. Renal cysts and high-density lesions that could be complicated
cysts or solid.
4. Coronary atherosclerosis, age advanced.

## 2019-08-21 IMAGING — MR MR LUMBAR SPINE W/O CM
5 series · 31 of 48 positions shown · non-contrast
Comparison: None.

CLINICAL DATA: Low back pain, acute in midline.

EXAM:
MRI LUMBAR SPINE WITHOUT CONTRAST
TECHNIQUE: Multiplanar, multisequence MR imaging of the lumbar spine was
performed. No intravenous contrast was administered.

[Series 5: T2 · sagittal · 4.0mm · 0.91mm/px · 6 of 15 slices shown (1 of 2)]
[im 1/15]
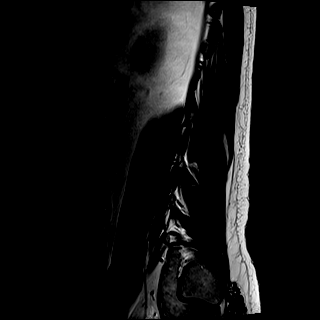
[im 3/15]
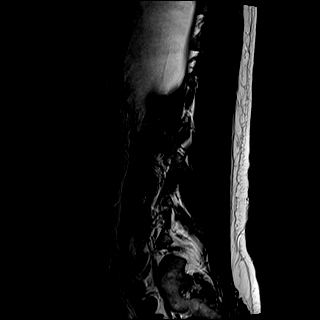
[im 6/15]
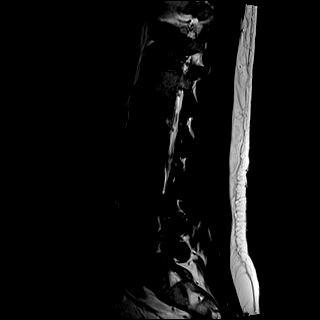
[im 9/15]
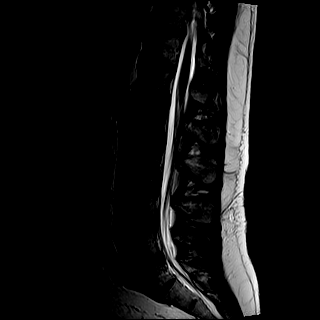
[im 12/15]
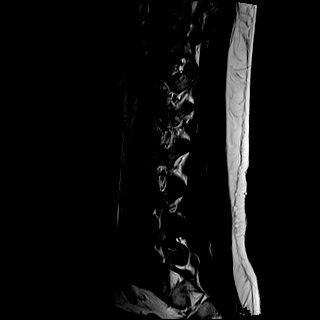
[im 15/15]
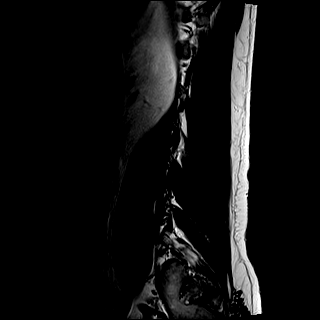

[Series 6: T1 · sagittal · 4.0mm · 0.91mm/px · 6 of 15 slices shown (1 of 2)]
[im 1/15]
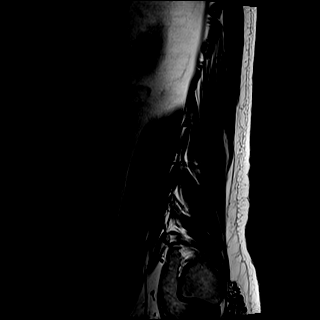
[im 3/15]
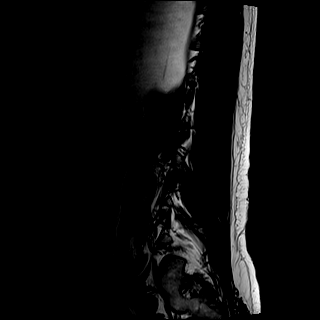
[im 6/15]
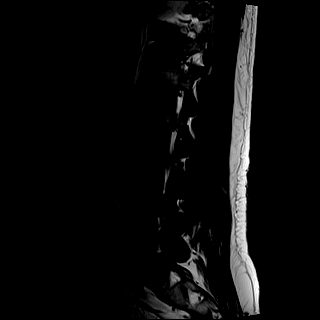
[im 9/15]
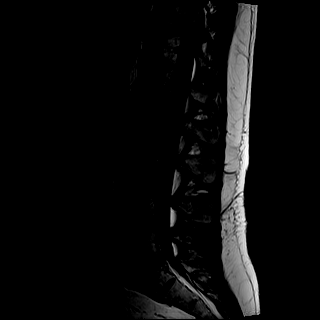
[im 12/15]
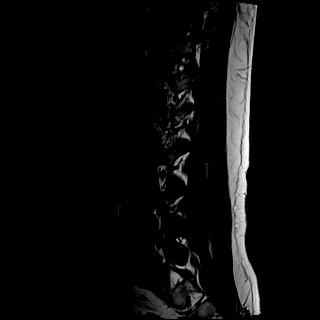
[im 15/15]
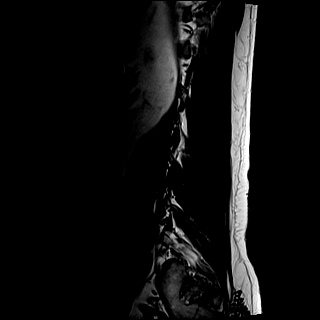

[Series 7: STIR · sagittal · 4.0mm · 0.45mm/px · 1 of 15 slices shown]
[im 1/15]
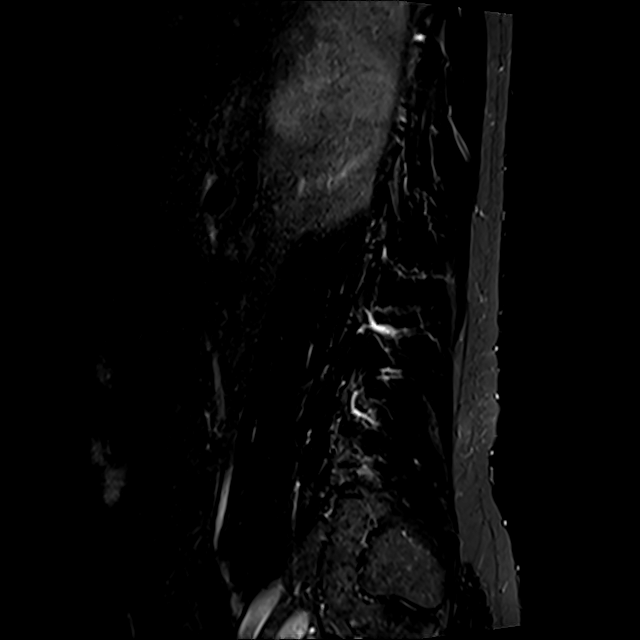

[Series 8: T2 · axial · 4.0mm · 0.78mm/px · z∈[-63,+169]mm · 9 of 38 slices shown (2 of 2)]
[im 1/38]
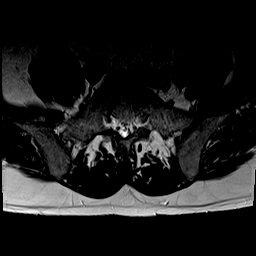
[im 6/38]
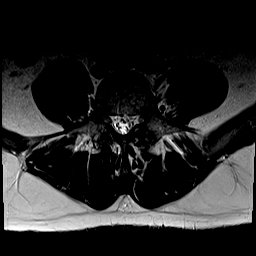
[im 11/38]
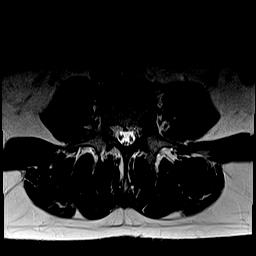
[im 16/38]
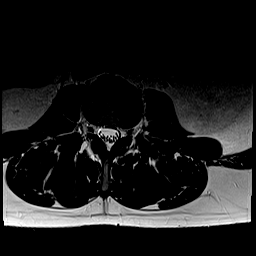
[im 19/38]
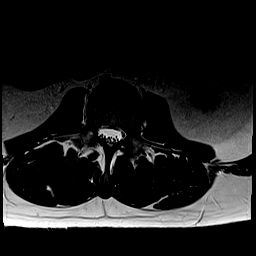
[im 22/38]
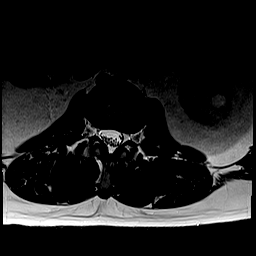
[im 27/38]
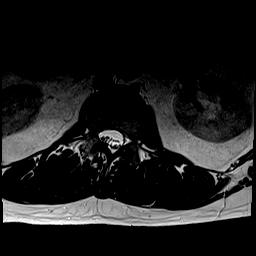
[im 32/38]
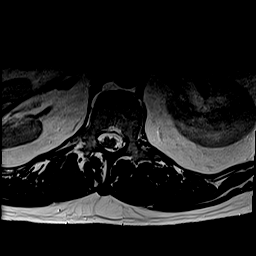
[im 38/38]
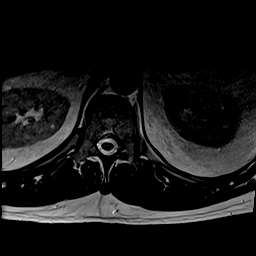

[Series 9: T1 · axial · 4.0mm · 0.39mm/px · z∈[-63,+169]mm · 9 of 38 slices shown (2 of 2)]
[im 1/38]
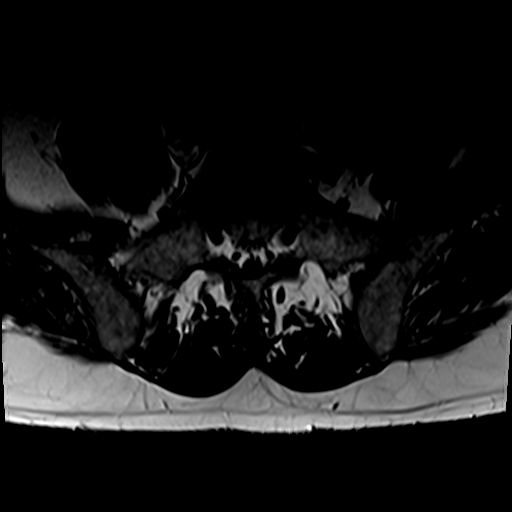
[im 6/38]
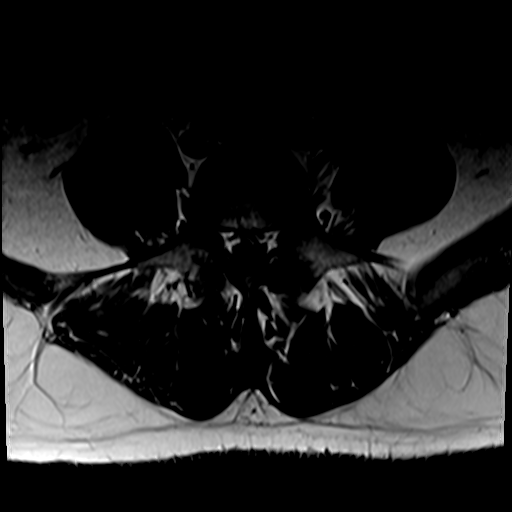
[im 11/38]
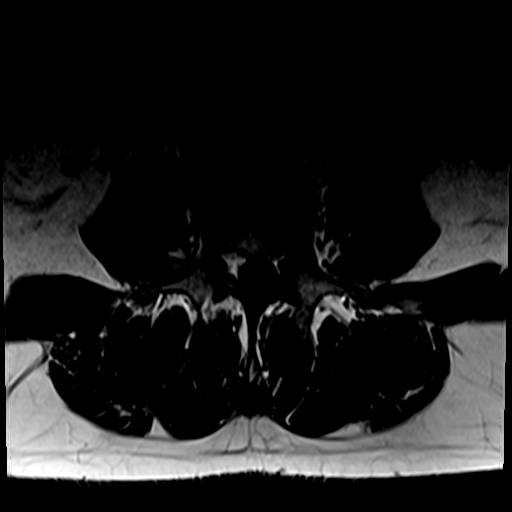
[im 16/38]
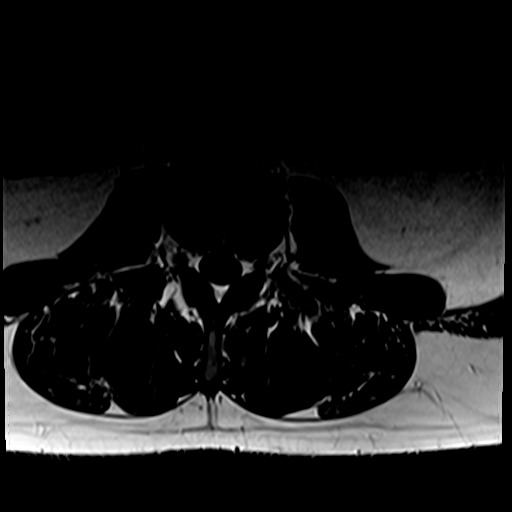
[im 19/38]
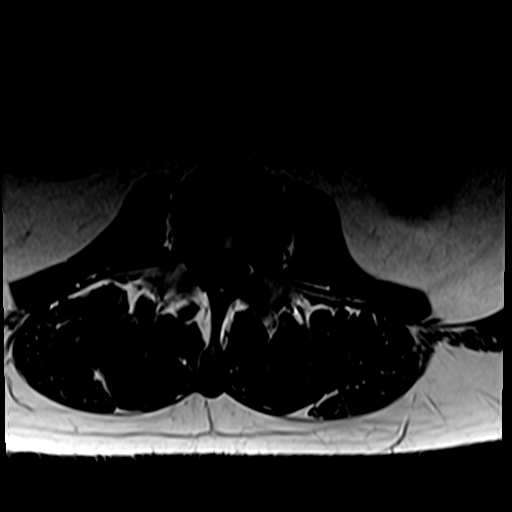
[im 22/38]
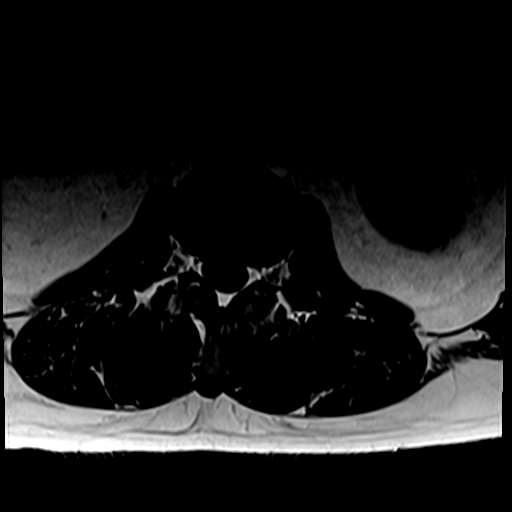
[im 27/38]
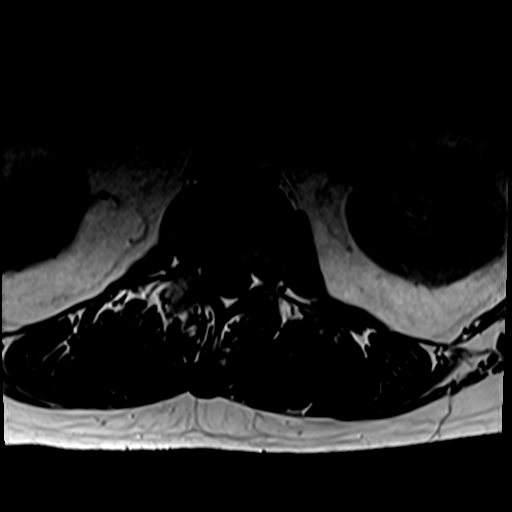
[im 32/38]
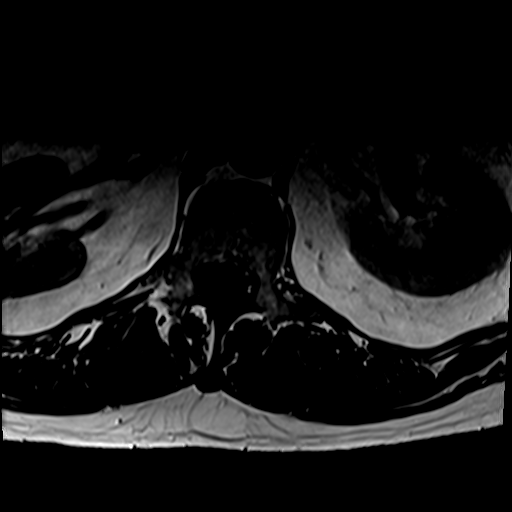
[im 38/38]
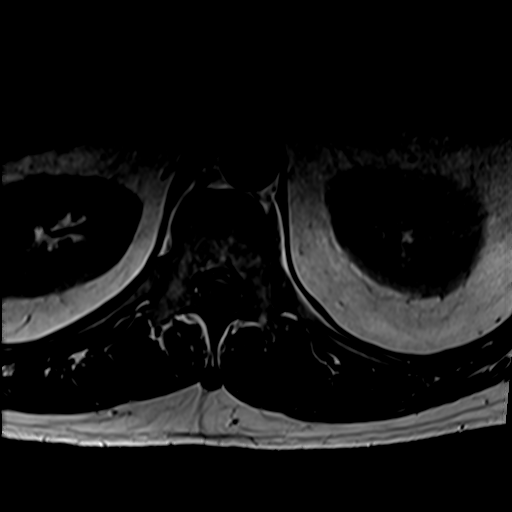

[31 of 48 positions shown; findings below may reference images not displayed]

FINDINGS: Segmentation:  Standard.

Alignment:  Physiologic.

Vertebrae: No fracture, evidence of discitis, or aggressive bone
lesion. Small hemangioma versus fatty marrow accentuation at
posterior T12. Probable atypical hemangioma at the L5 body.

Conus medullaris and cauda equina: Conus extends to the L1-2 level.
Conus and cauda equina appear normal.

Paraspinal and other soft tissues: Small renal cystic intensities.

Disc levels:

T12- L1: Unremarkable.

L1-L2: Unremarkable.

L2-L3: Unremarkable.

L3-L4: Unremarkable.

L4-L5: Disc narrowing and bulging with a right foraminal annular
fissure and protrusion. No neural compression

L5-S1:Disc narrowing and bulging with left paracentral to central
protrusion and buttressing osteophyte impinging on the descending
left S1 nerve root. Mild facet spurring with a left anterior facet
synovial cyst measuring 5 mm, itself not compressive.
IMPRESSION: 1. L5-S1 discogenic left subarticular recess stenosis with S1
impingement.
2. L5-S1 left facet synovial cyst without impingement.
3. Small right foraminal protrusion that is noncompressive

## 2019-08-21 MED ORDER — METHYLPREDNISOLONE 4 MG PO TBPK
ORAL_TABLET | ORAL | 0 refills | Status: DC
Start: 2019-08-21 — End: 2020-11-08

## 2019-08-21 MED ORDER — TAMSULOSIN HCL 0.4 MG PO CAPS
0.4000 mg | ORAL_CAPSULE | Freq: Every day | ORAL | 0 refills | Status: DC
Start: 2019-08-21 — End: 2020-11-06

## 2019-08-21 MED ORDER — SODIUM CHLORIDE 0.9 % IV BOLUS
1000.0000 mL | Freq: Once | INTRAVENOUS | Status: AC
  Administered 2019-08-21: 1000 mL via INTRAVENOUS

## 2019-08-21 MED ORDER — HYDROMORPHONE HCL 1 MG/ML IJ SOLN: 0.5000 mg | Freq: Once | INTRAMUSCULAR | Status: AC

## 2019-08-21 MED ORDER — HYDROMORPHONE HCL 1 MG/ML IJ SOLN
INTRAMUSCULAR | Status: AC
  Administered 2019-08-21: 0.5 mg via INTRAVENOUS
  Filled 2019-08-21: qty 1

## 2019-08-21 MED ORDER — ONDANSETRON HCL 4 MG/2ML IJ SOLN: 4.0000 mg | Freq: Once | INTRAMUSCULAR | Status: AC

## 2019-08-21 MED ORDER — KETOROLAC TROMETHAMINE 30 MG/ML IJ SOLN
15.0000 mg | Freq: Once | INTRAMUSCULAR | Status: AC
  Administered 2019-08-21: 15 mg via INTRAVENOUS

## 2019-08-21 MED ORDER — TAMSULOSIN HCL 0.4 MG PO CAPS
ORAL_CAPSULE | ORAL | Status: AC
  Administered 2019-08-21: 0.4 mg via ORAL
  Filled 2019-08-21: qty 1

## 2019-08-21 MED ORDER — ONDANSETRON 4 MG PO TBDP
4.0000 mg | ORAL_TABLET | Freq: Three times a day (TID) | ORAL | 0 refills | Status: DC | PRN
Start: 2019-08-21 — End: 2020-11-06

## 2019-08-21 MED ORDER — KETOROLAC TROMETHAMINE 30 MG/ML IJ SOLN
INTRAMUSCULAR | Status: AC
  Filled 2019-08-21: qty 1

## 2019-08-21 MED ORDER — TAMSULOSIN HCL 0.4 MG PO CAPS: 0.4000 mg | ORAL_CAPSULE | Freq: Once | ORAL | Status: AC

## 2019-08-21 MED ORDER — ONDANSETRON HCL 4 MG/2ML IJ SOLN
INTRAMUSCULAR | Status: AC
  Administered 2019-08-21: 4 mg via INTRAVENOUS
  Filled 2019-08-21: qty 2

## 2019-08-21 MED ORDER — HYDROCODONE-ACETAMINOPHEN 5-325 MG PO TABS: 1.0000 | ORAL_TABLET | Freq: Four times a day (QID) | ORAL | 0 refills | Status: DC | PRN

## 2019-08-21 NOTE — Discharge Instructions (Addendum)
1. Take pain & nausea medicines as needed (Norco/Zofran #30). Make sure to take a stool softener while taking narcotic pain medicines. 2. Take Flomax 0.4mg  daily x 14 days, follow up with urology. 3. Take steroid dose pack for back pain, follow up with neurosurgery (spine doctors) 4. Drink plenty of bottled or filtered water daily. 5. Return to the ER for worsening symptoms, persistent vomiting, fever, difficulty breathing or other concerns.

## 2019-08-21 NOTE — ED Provider Notes (Signed)
8:00 AM MRI as below. Awaiting UA. If not infectious, can consider steroid pack to assist w/ pain and referral to spine.   10:36 AM Still awaiting urine. Patient aware of need to provide sample.   11:17 AM UA c/w stone. No evidence of infection. As such, will proceed with discharge as planned.  Rx for Norco, Zofran, Flomax prepared by Dr. Dolores Frame.  Will add on a steroid taper as well for his back pain.  Will have him follow-up with urology + spine, as above, and given return precautions.  Radiology  MRI - IMPRESSION:  1. 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  2. L5-S1 left facet synovial cyst without impingement.  3. Small right foraminal protrusion that is noncompressive    Curtis Brown., MD 08/21/19 1118

## 2019-08-21 NOTE — ED Provider Notes (Signed)
[facility] Emergency Department Provider Note   ____________________________________________   First MD Initiated Contact with Patient 08/21/19 0423     (approximate)  I have reviewed the triage vital signs and the nursing notes.   HISTORY  Chief Complaint Back Pain    HPI Curtis Brown. is a 44 y.o. male brought to the ED from home via EMS with a chief complaint of flank pain.  Patient reports sudden onset of low back pain as he was getting ready for work this morning.  Symptoms associated with nausea and vomiting.  Prior history of kidney stones.  Denies fever, chest pain, shortness of breath, urinary difficulty, testicular pain/swelling.       Past medical history Kidney stones  Patient Active Problem List   Diagnosis Date Noted  . Unstable angina ([place]) 11/01/2017    Past Surgical History:  Procedure Laterality Date  . HERNIA REPAIR    . INGUINAL HERNIA REPAIR    . LEG SURGERY      Prior to Admission medications   Medication Sig Start Date End Date Taking? Authorizing Provider  HYDROcodone-acetaminophen (NORCO) 5-325 MG tablet Take 1 tablet by mouth every 6 (six) hours as needed for moderate pain. 08/21/19   Paulette Blanch, MD  losartan (COZAAR) 25 MG tablet Take 25 mg by mouth daily.    [provider]  ondansetron (ZOFRAN ODT) 4 MG disintegrating tablet Take 1 tablet (4 mg total) by mouth every 8 (eight) hours as needed for nausea or vomiting. 08/21/19   Paulette Blanch, MD  tamsulosin (FLOMAX) 0.4 MG CAPS capsule Take 1 capsule (0.4 mg total) by mouth daily. 08/21/19   Paulette Blanch, MD    Allergies Percocet [oxycodone-acetaminophen]  No family history on file.  Social History Social History   Tobacco Use  . Smoking status: Never Smoker  . Smokeless tobacco: Never Used  Substance Use Topics  . Alcohol use: Yes    Comment: occassionally  . Drug use: No    Review of Systems  Constitutional: No fever/chills Eyes: No  visual changes. ENT: No sore throat. Cardiovascular: Denies chest pain. Respiratory: Denies shortness of breath. Gastrointestinal: No abdominal pain.  Positive for nausea and vomiting.  No diarrhea.  No constipation. Genitourinary: Negative for dysuria. Musculoskeletal: Positive for back pain. Skin: Negative for rash. Neurological: Negative for headaches, focal weakness or numbness.   ____________________________________________   PHYSICAL EXAM:  VITAL SIGNS: ED Triage Vitals [08/21/19 0421]  Enc Vitals Group     BP      Pulse Rate 80     Resp 17     Temp 98.1 F (36.7 C)     Temp src      SpO2 97 %     Weight      Height      Head Circumference      Peak Flow      Pain Score      Pain Loc      Pain Edu?      Excl. in [place]?     Constitutional: Alert and oriented. Well appearing and in moderate acute distress. Eyes: Conjunctivae are normal. PERRL. EOMI. Head: Atraumatic. Nose: No congestion/rhinnorhea. Mouth/Throat: Mucous membranes are moist.  Oropharynx non-erythematous. Neck: No stridor.   Cardiovascular: Normal rate, regular rhythm. Grossly normal heart sounds.  Good peripheral circulation. Respiratory: Normal respiratory effort.  No retractions. Lungs CTAB. Gastrointestinal: Soft and nontender to light or deep palpation. No distention. No abdominal bruits.  Mild left CVA tenderness. Musculoskeletal: Mild midline lumbar spine tenderness to palpation.  No lower extremity tenderness nor edema.  No joint effusions. Neurologic:  Normal speech and language. No gross focal neurologic deficits are appreciated.  Skin:  Skin is warm, dry and intact. No rash noted. Psychiatric: Mood and affect are normal. Speech and behavior are normal.  ____________________________________________   LABS (all labs ordered are listed, but only abnormal results are displayed)  Labs Reviewed  COMPREHENSIVE METABOLIC PANEL - Abnormal; Notable for the following components:      Result  Value   Potassium 3.2 (*)    Glucose, Bld 129 (*)    BUN 21 (*)    Calcium 8.6 (*)    All other components within normal limits  CBC WITH DIFFERENTIAL/PLATELET  LIPASE, BLOOD  URINALYSIS, COMPLETE (UACMP) WITH MICROSCOPIC   ____________________________________________  EKG  None ____________________________________________  RADIOLOGY  ED MD interpretation: CT demonstrates punctate left UVJ calculus; MRI pending  Official radiology report(s): CT Renal Stone Study  Result Date: 08/21/2019 CLINICAL DATA:  Left flank pain with kidney stone suspected. History of hernia repair EXAM: CT ABDOMEN AND PELVIS WITHOUT CONTRAST TECHNIQUE: Multidetector CT imaging of the abdomen and pelvis was performed following the standard protocol without IV contrast. COMPARISON:  None. FINDINGS: Lower chest:  Coronary atherosclerosis Hepatobiliary: No focal liver abnormality.No evidence of biliary obstruction or stone. Pancreas: Unremarkable. Spleen: Unremarkable. Adrenals/Urinary Tract: Negative adrenals. Punctate (1-2 mm) stone at the left UVJ without hydronephrosis. There is a cluster of tiny renal calculi at the upper pole left kidney. Tiny right renal calculus on coronal reformats. Bilateral renal cystic densities and a very dense lesion at the upper pole right kidney measuring 7 mm, densitometry more consistent with hemorrhagic cyst. There are other intermediate lesions which are less than 1 cm. Unremarkable bladder. Stomach/Bowel: No obstruction. The appendix is not visualized. No pericecal inflammation. Vascular/Lymphatic: No acute vascular abnormality. No mass or adenopathy. Reproductive:No pathologic findings. Other: No ascites or pneumoperitoneum. Ventral hernia repair using mesh. Musculoskeletal: No acute abnormalities.  Mild levoscoliosis IMPRESSION: 1. Punctate left UVJ calculus. 2. Left more than right small renal calculi. 3. Renal cysts and high-density lesions that could be complicated cysts or solid.  4. Coronary atherosclerosis, age advanced. Electronically Signed   By: Marnee Spring M.D.   On: 08/21/2019 05:14    ____________________________________________   PROCEDURES  Procedure(s) performed (including Critical Care):  Procedures   ____________________________________________   INITIAL IMPRESSION / ASSESSMENT AND PLAN / ED COURSE  As part of my medical decision making, I reviewed the following data within the electronic MEDICAL RECORD NUMBER Nursing notes reviewed and incorporated, Labs reviewed, Old chart reviewed, Radiograph reviewed, Notes from prior ED visits and [place] Controlled Substance Database     Skylier Kretschmer. was evaluated in Emergency Department on 08/21/2019 for the symptoms described in the history of present illness. He was evaluated in the context of the global COVID-19 pandemic, which necessitated consideration that the patient might be at risk for infection with the SARS-CoV-2 virus that causes COVID-19. Institutional protocols and algorithms that pertain to the evaluation of patients at risk for COVID-19 are in a state of rapid change based on information released by regulatory bodies including the CDC and federal and state organizations. These policies and algorithms were followed during the patient's care in the ED.    44 year old male presenting with left flank pain. Differential diagnosis includes, but is not limited to, acute appendicitis, renal colic, testicular torsion, urinary tract  infection/pyelonephritis, prostatitis,  epididymitis, diverticulitis, small bowel obstruction or ileus, colitis, abdominal aortic aneurysm, gastroenteritis, hernia, etc.  Will obtain basic lab work, UA, CT renal colic study.  Initiate IV fluid resuscitation, IV Dilaudid for pain paired with IV Zofran for nausea.   Clinical Course as of Aug 20 716  Wed Aug 21, 2019  4782 Updated patient on CT result.  Patient complaining of midline lumbar spine pain.  Denies  numbness/tingling/weakness to his lower extremities.  Denies bowel or bladder incontinence.  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  He is very specific with the location of spinal pain.  Will obtain MRI.   [JS]  [id number] Care transferred to Dr. Colon Branch at change of shift pending MRI result.   [JS]    Clinical Course User Index [JS] Irean Hong, MD     ____________________________________________   FINAL CLINICAL IMPRESSION(S) / ED DIAGNOSES  Final diagnoses:  Flank pain  Renal colic on left side     ED Discharge Orders         Ordered    HYDROcodone-acetaminophen (NORCO) 5-325 MG tablet  Every 6 hours PRN     Discontinue  Reprint     08/21/19 0713    ondansetron (ZOFRAN ODT) 4 MG disintegrating tablet  Every 8 hours PRN     Discontinue  Reprint     08/21/19 0713    tamsulosin (FLOMAX) 0.4 MG CAPS capsule  Daily     Discontinue  Reprint     08/21/19 0713           Note:  This document was prepared using Dragon voice recognition software and may include unintentional dictation errors.   Irean Hong, MD 08/21/19 [phone]

## 2020-06-07 ENCOUNTER — Emergency Department
Admission: EM | Admit: 2020-06-07 | Discharge: 2020-06-08 | Disposition: A | Discharge status: Home / Self Care | Payer: Managed Care, Other (non HMO) | Attending: Emergency Medicine | Admitting: Emergency Medicine

## 2020-06-07 ENCOUNTER — Encounter: Payer: Self-pay | Admitting: Emergency Medicine

## 2020-06-07 DIAGNOSIS — Z79899 Other long term (current) drug therapy: Secondary | ICD-10-CM | POA: Diagnosis not present

## 2020-06-07 DIAGNOSIS — R112 Nausea with vomiting, unspecified: Secondary | ICD-10-CM | POA: Diagnosis not present

## 2020-06-07 LAB — URINALYSIS, COMPLETE (UACMP) WITH MICROSCOPIC
Bacteria, UA: NONE SEEN
Bilirubin Urine: NEGATIVE
Glucose, UA: NEGATIVE mg/dL
Ketones, ur: 5 mg/dL — AB
Leukocytes,Ua: NEGATIVE
Nitrite: NEGATIVE
Protein, ur: NEGATIVE mg/dL
Specific Gravity, Urine: 1.016 (ref 1.005–1.030)
Squamous Epithelial / HPF: NONE SEEN (ref 0–5)
pH: 6 (ref 5.0–8.0)

## 2020-06-07 LAB — CBC
HCT: 45.9 % (ref 39.0–52.0)
Hemoglobin: 15.7 g/dL (ref 13.0–17.0)
MCH: 29.2 pg (ref 26.0–34.0)
MCHC: 34.2 g/dL (ref 30.0–36.0)
MCV: 85.3 fL (ref 80.0–100.0)
Platelets: 182 10*3/uL (ref 150–400)
RBC: 5.38 MIL/uL (ref 4.22–5.81)
RDW: 12.5 % (ref 11.5–15.5)
WBC: 8.2 10*3/uL (ref 4.0–10.5)
nRBC: 0 % (ref 0.0–0.2)

## 2020-06-07 LAB — COMPREHENSIVE METABOLIC PANEL
ALT: 35 U/L (ref 0–44)
AST: 25 U/L (ref 15–41)
Albumin: 4.3 g/dL (ref 3.5–5.0)
Alkaline Phosphatase: 75 U/L (ref 38–126)
Anion gap: 9 (ref 5–15)
BUN: 11 mg/dL (ref 6–20)
CO2: 25 mmol/L (ref 22–32)
Calcium: 8.5 mg/dL — ABNORMAL LOW (ref 8.9–10.3)
Chloride: 106 mmol/L (ref 98–111)
Creatinine, Ser: 0.98 mg/dL (ref 0.61–1.24)
GFR, Estimated: >60 mL/min (ref >60)
Glucose, Bld: 118 mg/dL — ABNORMAL HIGH (ref 70–99)
Potassium: 3.6 mmol/L (ref 3.5–5.1)
Sodium: 140 mmol/L (ref 135–145)
Total Bilirubin: 1 mg/dL (ref 0.3–1.2)
Total Protein: 7.7 g/dL (ref 6.5–8.1)

## 2020-06-07 LAB — LIPASE, BLOOD: Lipase: 24 U/L (ref 11–51)

## 2020-06-07 MED ORDER — SODIUM CHLORIDE 0.9 % IV SOLN
25.0000 mg | Freq: Once | INTRAVENOUS | Status: AC
  Administered 2020-06-08: 25 mg via INTRAVENOUS
  Filled 2020-06-07: qty 1

## 2020-06-07 MED ORDER — METOCLOPRAMIDE HCL 5 MG/ML IJ SOLN
10.0000 mg | Freq: Once | INTRAMUSCULAR | Status: AC
  Administered 2020-06-07: 10 mg via INTRAVENOUS
  Filled 2020-06-07: qty 2

## 2020-06-07 MED ORDER — SODIUM CHLORIDE 0.9 % IV BOLUS (SEPSIS)
1000.0000 mL | Freq: Once | INTRAVENOUS | Status: AC
  Administered 2020-06-08: 1000 mL via INTRAVENOUS

## 2020-06-07 NOTE — ED Notes (Signed)
Green top resent to lab at this time. 

## 2020-06-07 NOTE — ED Triage Notes (Signed)
Patient from place eating wine and seafood when 30 minutes later pt began to feel nauseous and vomit, pt denies food allergy - via [facility] who gave 4 zofran IV with minimal help  Pt having dry heaves and without apparent rash

## 2020-06-07 NOTE — ED Provider Notes (Signed)
[facility] Emergency Department Provider Note  ____________________________________________   Event Date/Time   First MD Initiated Contact with Patient 06/07/20 2334     (approximate)  I have reviewed the triage vital signs and the nursing notes.   HISTORY  Chief Complaint Nausea and Emesis    HPI Curtis Brown. is a 45 y.o. male with history of kidney stones, previous inguinal hernia repair who presents to the emergency department with complaints of nausea and vomiting that started about 2 hours after eating seafood tonight.  He denies any sick contacts.  No fevers, abdominal pain, diarrhea.  Reports his throat is sore from vomiting so much.  Received Zofran with EMS without much relief.        Past Medical History:  Diagnosis Date  . Kidney stone     Patient Active Problem List   Diagnosis Date Noted  . Unstable angina ([facility]) 11/01/2017    Past Surgical History:  Procedure Laterality Date  . HERNIA REPAIR    . INGUINAL HERNIA REPAIR    . LEG SURGERY      Prior to Admission medications   Medication Sig Start Date End Date Taking? Authorizing Provider  promethazine (PHENERGAN) 25 MG tablet Take 1 tablet (25 mg total) by mouth every 6 (six) hours as needed for nausea or vomiting. 06/08/20  Yes Neli Fofana, Layla Maw, DO  HYDROcodone-acetaminophen (NORCO) 5-325 MG tablet Take 1 tablet by mouth every 6 (six) hours as needed for moderate pain. 08/21/19   Irean Hong, MD  losartan (COZAAR) 25 MG tablet Take 25 mg by mouth daily.    [provider]  methylPREDNISolone (MEDROL DOSEPAK) 4 MG TBPK tablet Take as directed on the packaging 08/21/19   Miguel Aschoff., MD  ondansetron (ZOFRAN ODT) 4 MG disintegrating tablet Take 1 tablet (4 mg total) by mouth every 8 (eight) hours as needed for nausea or vomiting. 08/21/19   Irean Hong, MD  tamsulosin (FLOMAX) 0.4 MG CAPS capsule Take 1 capsule (0.4 mg total) by mouth daily. 08/21/19   Irean Hong, MD     Allergies Oxycodone-acetaminophen and Percocet [oxycodone-acetaminophen]  History reviewed. No pertinent family history.  Social History Social History   Tobacco Use  . Smoking status: Never Smoker  . Smokeless tobacco: Never Used  Substance Use Topics  . Alcohol use: Yes    Comment: occassionally  . Drug use: No    Review of Systems Constitutional: No fever. Eyes: No visual changes. ENT: No sore throat. Cardiovascular: Denies chest pain. Respiratory: Denies shortness of breath. Gastrointestinal: Positive for nausea and vomiting.  No diarrhea. Genitourinary: Negative for dysuria. Musculoskeletal: Negative for back pain. Skin: Negative for rash. Neurological: Negative for focal weakness or numbness.  ____________________________________________   PHYSICAL EXAM:  VITAL SIGNS: ED Triage Vitals  Enc Vitals Group     BP 06/07/20 1932 119/81     Pulse Rate 06/07/20 1932 89     Resp 06/07/20 1932 16     Temp 06/07/20 1932 98 F (36.7 C)     Temp Source 06/07/20 1932 Oral     SpO2 06/07/20 1928 98 %     Weight 06/07/20 1933 208 lb (94.3 kg)     Height 06/07/20 1933 5\' 10"  (1.778 m)     Head Circumference --      Peak Flow --      Pain Score --      Pain Loc --      Pain  Edu? --      Excl. in [place]? --    CONSTITUTIONAL: Alert and oriented and responds appropriately to questions. Well-appearing; well-nourished HEAD: Normocephalic EYES: Conjunctivae clear, pupils appear equal, EOM appear intact ENT: normal nose; moist mucous membranes, normal phonation, no stridor or drooling NECK: Supple, normal ROM CARD: RRR; S1 and S2 appreciated; no murmurs, no clicks, no rubs, no gallops RESP: Normal chest excursion without splinting or tachypnea; breath sounds clear and equal bilaterally; no wheezes, no rhonchi, no rales, no hypoxia or respiratory distress, speaking full sentences ABD/GI: Normal bowel sounds; non-distended; soft, non-tender, no rebound, no guarding, no  peritoneal signs, no hepatosplenomegaly, no tenderness at McBurney's point BACK: The back appears normal EXT: Normal ROM in all joints; no deformity noted, no edema; no cyanosis SKIN: Normal color for age and race; warm; no rash on exposed skin NEURO: Moves all extremities equally PSYCH: The patient's mood and manner are appropriate.  ____________________________________________   LABS (all labs ordered are listed, but only abnormal results are displayed)  Labs Reviewed  URINALYSIS, COMPLETE (UACMP) WITH MICROSCOPIC - Abnormal; Notable for the following components:      Result Value   Color, Urine YELLOW (*)    APPearance HAZY (*)    Hgb urine dipstick MODERATE (*)    Ketones, ur 5 (*)    All other components within normal limits  COMPREHENSIVE METABOLIC PANEL - Abnormal; Notable for the following components:   Glucose, Bld 118 (*)    Calcium 8.5 (*)    All other components within normal limits  CBC  LIPASE, BLOOD   ____________________________________________  EKG  none ____________________________________________  RADIOLOGY I, Curtis Brown, personally viewed and evaluated these images (plain radiographs) as part of my medical decision making, as well as reviewing the written report by the radiologist.  ED MD interpretation:  none  Official radiology report(s): No results found.  ____________________________________________   PROCEDURES  Procedure(s) performed (including Critical Care):  Procedures   ____________________________________________   INITIAL IMPRESSION / ASSESSMENT AND PLAN / ED COURSE  As part of my medical decision making, I reviewed the following data within the electronic MEDICAL RECORD NUMBER Nursing notes reviewed and incorporated, Labs reviewed , Old chart reviewed and Notes from prior ED visits         Patient here with nausea and vomiting.  Discussed with patient that this may be foodborne illness versus viral in nature.  His abdominal  exam is benign.  Low suspicion for appendicitis, colitis, diverticulitis, bowel obstruction, perforation.  His labs here are reassuring with normal white blood cell count, creatinine, LFTs and lipase.  Urine shows no sign of infection but does show small ketones.  He received Zofran with EMS without much relief.  Received Reglan while in triage but states nausea is now coming back.  Will give Phenergan, p.o. challenge.  Declines any pain medication for his throat.  ED PROGRESS  Patient reports feeling better.  Able to tolerate p.o.  Seems like Phenergan has helped him the most.  Will discharge with prescription of the same.  Patient will be discharged once IV fluids complete.  Discussed supportive care instructions, return precautions.  At this time, I do not feel there is any life-threatening condition present. I have reviewed, interpreted and discussed all results (EKG, imaging, lab, urine as appropriate) and exam findings with patient/family. I have reviewed nursing notes and appropriate previous records.  I feel the patient is safe to be discharged home without further emergent workup and can  continue workup as an outpatient as needed. Discussed usual and customary return precautions. Patient/family verbalize understanding and are comfortable with this plan.  Outpatient follow-up has been provided as needed. All questions have been answered.  ____________________________________________   FINAL CLINICAL IMPRESSION(S) / ED DIAGNOSES  Final diagnoses:  Nausea and vomiting in adult     ED Discharge Orders         Ordered    promethazine (PHENERGAN) 25 MG tablet  Every 6 hours PRN        06/08/20 0104          *Please note:  Curtis Skill. was evaluated in Emergency Department on 06/08/2020 for the symptoms described in the history of present illness. He was evaluated in the context of the global COVID-19 pandemic, which necessitated consideration that the patient might be at risk for  infection with the SARS-CoV-2 virus that causes COVID-19. Institutional protocols and algorithms that pertain to the evaluation of patients at risk for COVID-19 are in a state of rapid change based on information released by regulatory bodies including the CDC and federal and state organizations. These policies and algorithms were followed during the patient's care in the ED.  Some ED evaluations and interventions may be delayed as a result of limited staffing during and the pandemic.*   Note:  This document was prepared using Dragon voice recognition software and may include unintentional dictation errors.   Curtis Brown, Layla Maw, DO 06/08/20 0104

## 2020-06-07 NOTE — ED Notes (Signed)
Pt to toilet, ambulatory , reports feeling better, urine provided

## 2020-06-08 MED ORDER — PROMETHAZINE HCL 25 MG PO TABS: 25.0000 mg | ORAL_TABLET | Freq: Four times a day (QID) | ORAL | 0 refills | Status: DC | PRN

## 2020-11-05 ENCOUNTER — Other Ambulatory Visit: Payer: Self-pay

## 2020-11-05 ENCOUNTER — Emergency Department: Payer: Managed Care, Other (non HMO)

## 2020-11-05 ENCOUNTER — Encounter: Payer: Self-pay | Admitting: *Deleted

## 2020-11-05 DIAGNOSIS — R109 Unspecified abdominal pain: Secondary | ICD-10-CM | POA: Diagnosis present

## 2020-11-05 DIAGNOSIS — N281 Cyst of kidney, acquired: Secondary | ICD-10-CM | POA: Insufficient documentation

## 2020-11-05 DIAGNOSIS — N202 Calculus of kidney with calculus of ureter: Secondary | ICD-10-CM | POA: Insufficient documentation

## 2020-11-05 LAB — CBC
HCT: 44.8 % (ref 39.0–52.0)
Hemoglobin: 15.9 g/dL (ref 13.0–17.0)
MCH: 29.3 pg (ref 26.0–34.0)
MCHC: 35.5 g/dL (ref 30.0–36.0)
MCV: 82.7 fL (ref 80.0–100.0)
Platelets: 209 10*3/uL (ref 150–400)
RBC: 5.42 MIL/uL (ref 4.22–5.81)
RDW: 12.6 % (ref 11.5–15.5)
WBC: 5.9 10*3/uL (ref 4.0–10.5)
nRBC: 0 % (ref 0.0–0.2)

## 2020-11-05 LAB — BASIC METABOLIC PANEL
Anion gap: 9 (ref 5–15)
BUN: 17 mg/dL (ref 6–20)
CO2: 24 mmol/L (ref 22–32)
Calcium: 9.1 mg/dL (ref 8.9–10.3)
Chloride: 105 mmol/L (ref 98–111)
Creatinine, Ser: 1.09 mg/dL (ref 0.61–1.24)
GFR, Estimated: >60 mL/min (ref >60)
Glucose, Bld: 116 mg/dL — ABNORMAL HIGH (ref 70–99)
Potassium: 3.3 mmol/L — ABNORMAL LOW (ref 3.5–5.1)
Sodium: 138 mmol/L (ref 135–145)

## 2020-11-05 IMAGING — CT CT RENAL STONE PROTOCOL
2 of 4 series · 16 of 46 positions shown, 18 images · non-contrast
Comparison: [DATE]

CLINICAL DATA: Left flank pain.

EXAM:
CT ABDOMEN AND PELVIS WITHOUT CONTRAST
TECHNIQUE: Multidetector CT imaging of the abdomen and pelvis was performed
following the standard protocol without IV contrast.

[Series 2: stone full standard · axial · 0.87mm/px · z∈[-527,-42]mm · 13 of 107 slices shown, 15 images]
[im 5/107  soft-tissue]
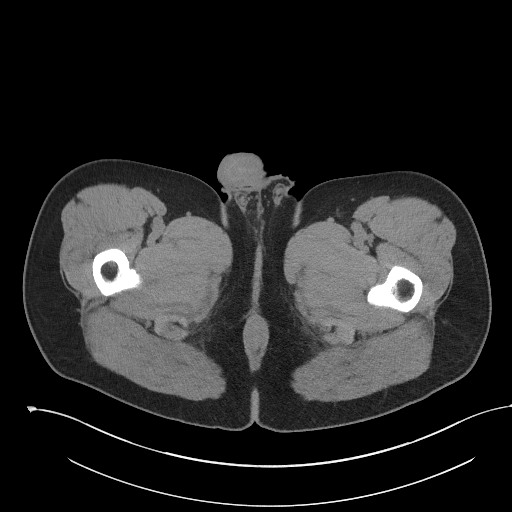
[im 5/107  bone]
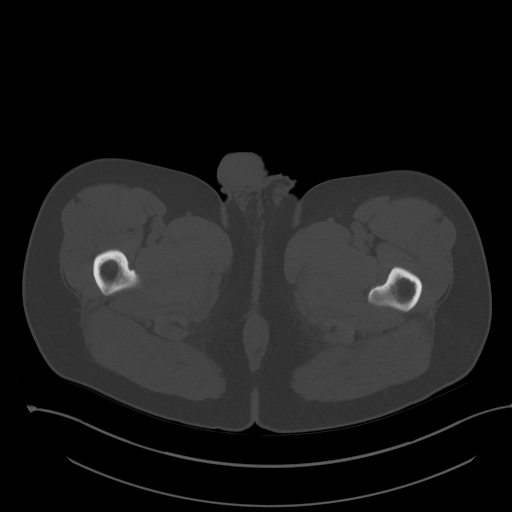
[im 15/107  soft-tissue]
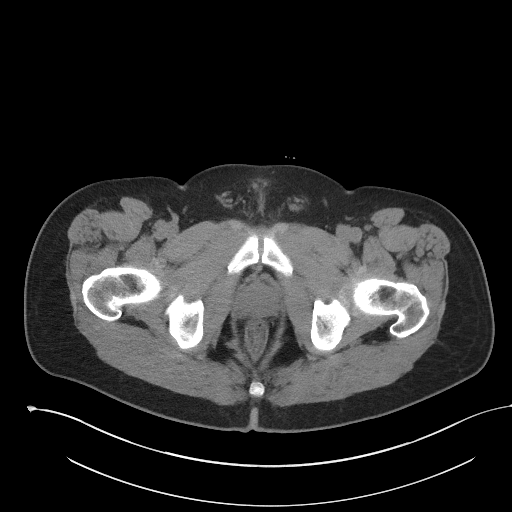
[im 25/107  soft-tissue]
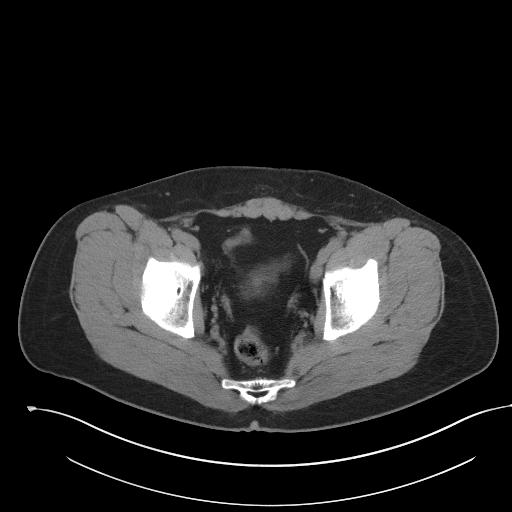
[im 29/107  soft-tissue]
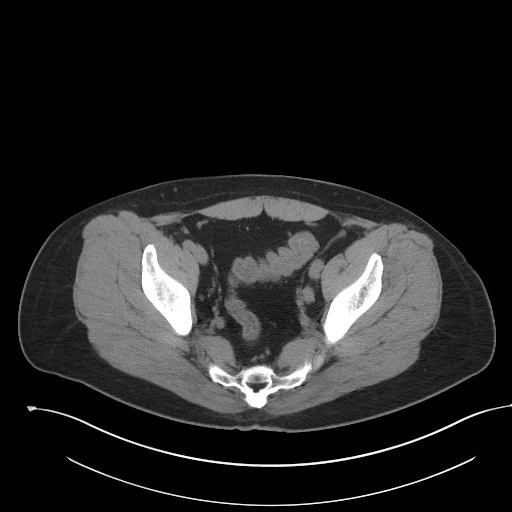
[im 39/107  soft-tissue]
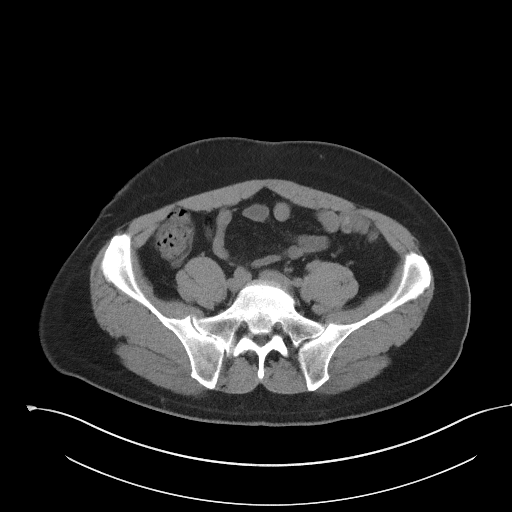
[im 44/107  soft-tissue]
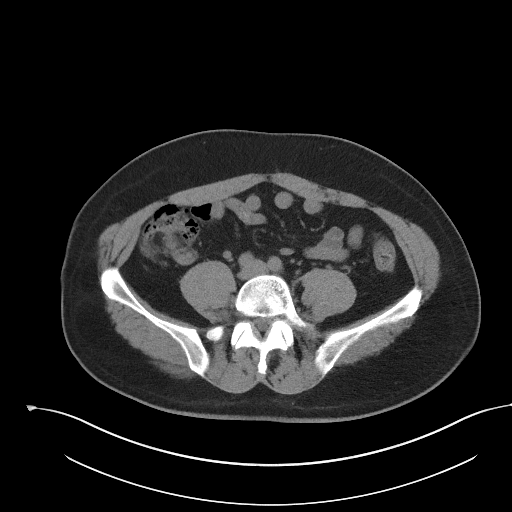
[im 54/107  soft-tissue]
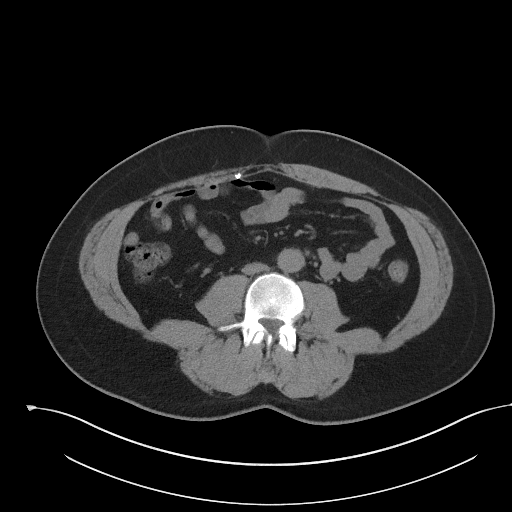
[im 63/107  soft-tissue]
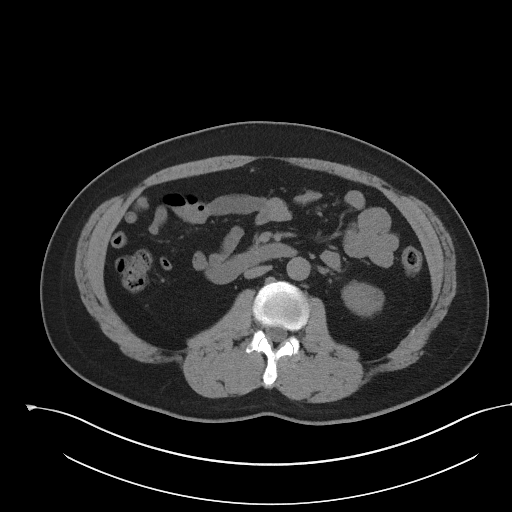
[im 68/107  soft-tissue]
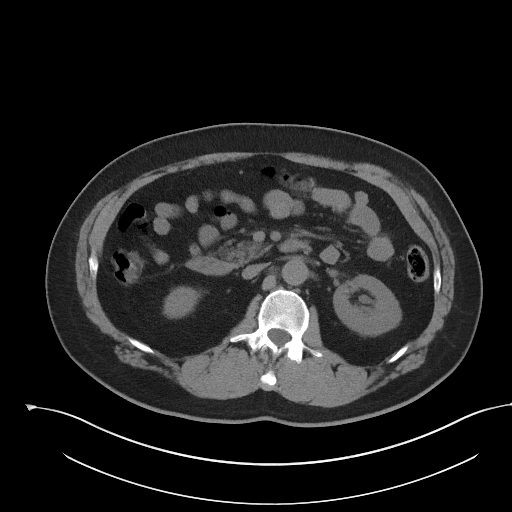
[im 68/107  bone]
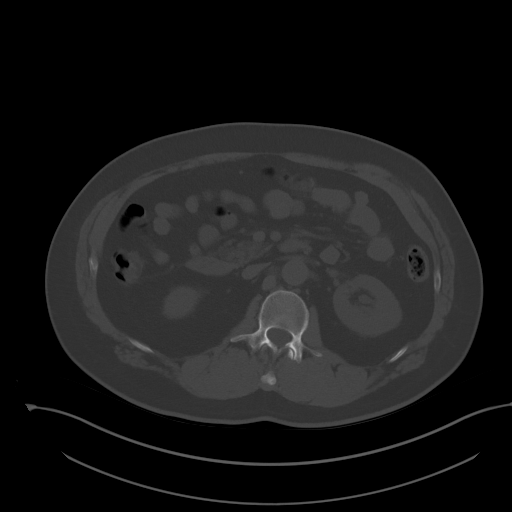
[im 78/107  soft-tissue]
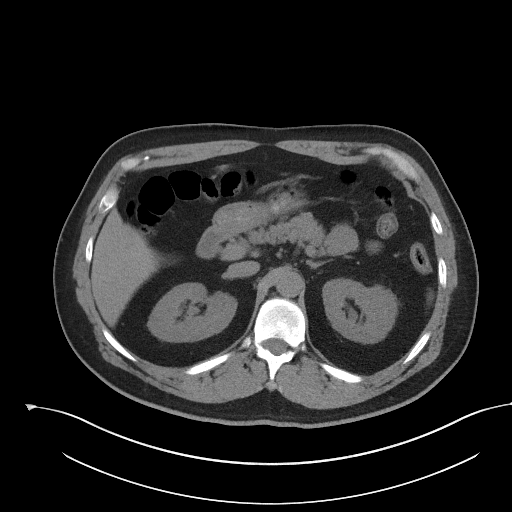
[im 82/107  soft-tissue]
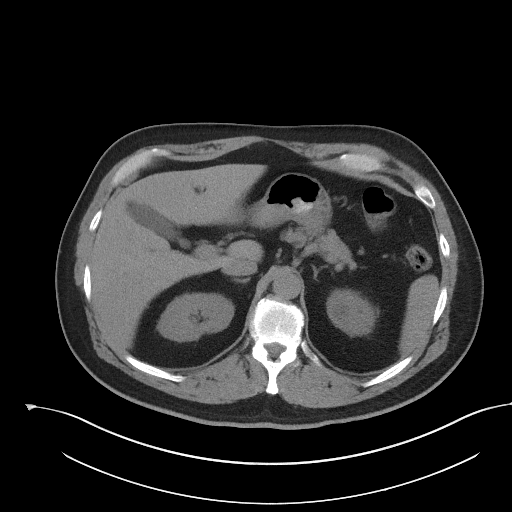
[im 92/107  soft-tissue]
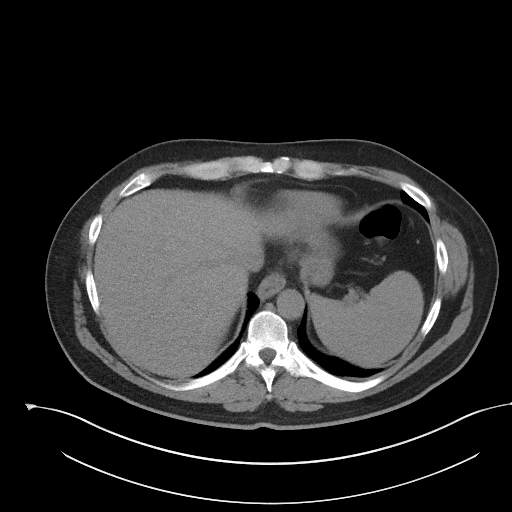
[im 102/107  soft-tissue]
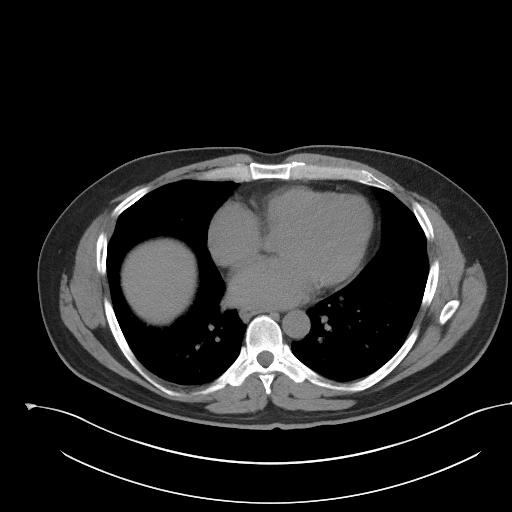

[Series 5: coronal · coronal · 0.83mm/px · 3 of 138 slices shown]
[im 46/138  soft-tissue]
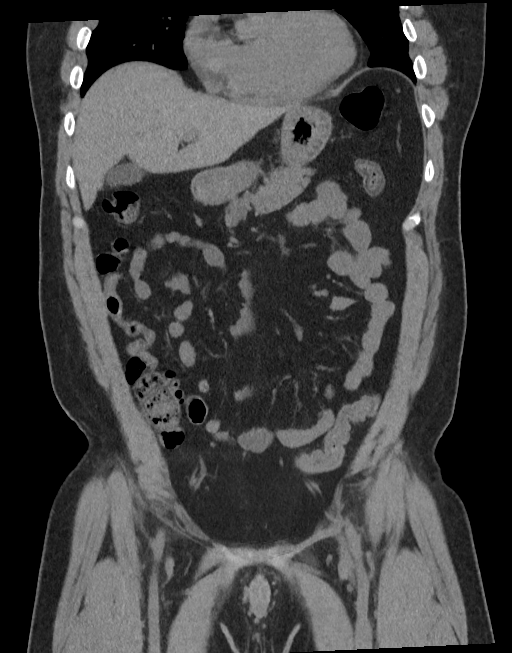
[im 61/138  soft-tissue]
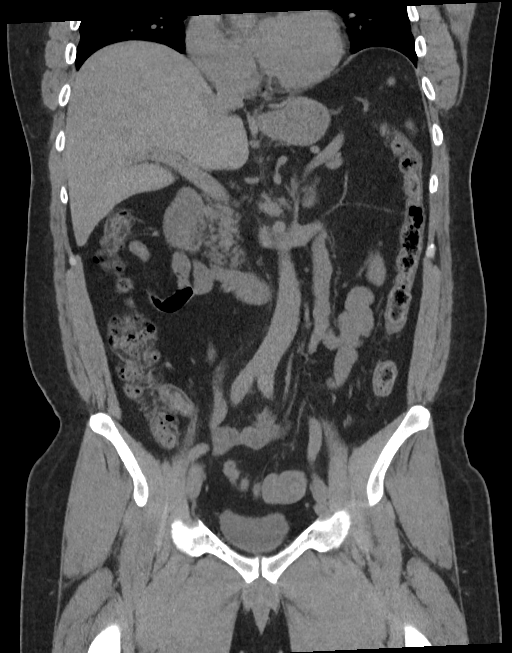
[im 77/138  soft-tissue]
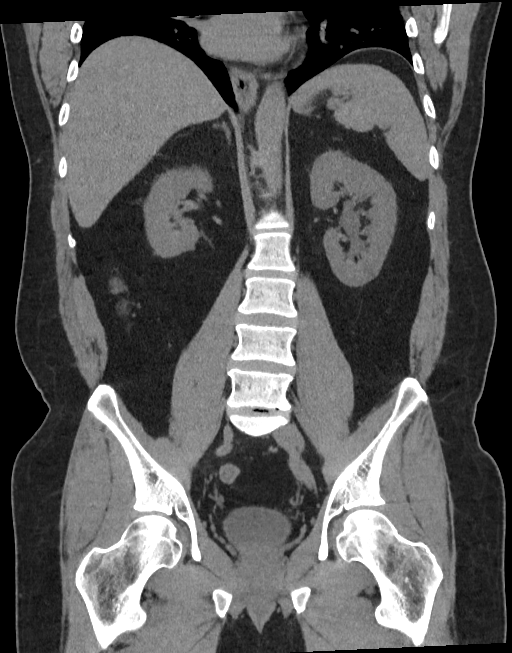

[16 of 46 positions shown; findings below may reference images not displayed]

FINDINGS: Lower chest: No acute abnormality.

Hepatobiliary: No focal liver abnormality is seen. No gallstones,
gallbladder wall thickening, or biliary dilatation.

Pancreas: Unremarkable. No pancreatic ductal dilatation or
surrounding inflammatory changes.

Spleen: Normal in size without focal abnormality.

Adrenals/Urinary Tract: Adrenal glands are unremarkable. Kidneys are
normal in size. A 4 mm obstructing renal stone is seen within the
proximal left ureter, with mild left-sided hydronephrosis and
hydroureter. Small, predominantly stable bilateral hemorrhagic and
nonhemorrhagic renal cysts are seen. Bladder is unremarkable.

Stomach/Bowel: Stomach is within normal limits. Appendix appears
normal. No evidence of bowel wall thickening, distention, or
inflammatory changes.

Vascular/Lymphatic: No significant vascular findings are present. No
enlarged abdominal or pelvic lymph nodes.

Reproductive: Prostate is unremarkable.

Other: Multiple surgical coils are seen within the anterior aspect
of the mid abdomen, along the midline. No abdominopelvic ascites.

Musculoskeletal: No acute or significant osseous findings.
IMPRESSION: 1. 4 mm obstructing renal stone within the proximal left ureter.
2. Bilateral hemorrhagic and nonhemorrhagic renal cysts.

## 2020-11-05 MED ORDER — ONDANSETRON HCL 4 MG/2ML IJ SOLN
4.0000 mg | Freq: Once | INTRAMUSCULAR | Status: AC
  Administered 2020-11-05: 4 mg via INTRAVENOUS
  Filled 2020-11-05: qty 2

## 2020-11-05 MED ORDER — FENTANYL CITRATE PF 50 MCG/ML IJ SOSY
50.0000 ug | PREFILLED_SYRINGE | INTRAMUSCULAR | Status: DC | PRN
  Administered 2020-11-05: 50 ug via INTRAVENOUS
  Filled 2020-11-05: qty 1

## 2020-11-05 NOTE — ED Triage Notes (Signed)
Reporting left lower flank pain, started today, vomiting. Hx of kidney stone.

## 2020-11-05 NOTE — ED Notes (Signed)
Patient aware that we need urine sample for testing, unable at this time. Pt given instruction on providing urine sample when able to do so.   

## 2020-11-06 ENCOUNTER — Emergency Department
Admission: EM | Admit: 2020-11-06 | Discharge: 2020-11-06 | Disposition: A | Discharge status: Home / Self Care | Payer: Managed Care, Other (non HMO) | Attending: Emergency Medicine | Admitting: Emergency Medicine

## 2020-11-06 DIAGNOSIS — N281 Cyst of kidney, acquired: Secondary | ICD-10-CM

## 2020-11-06 DIAGNOSIS — N200 Calculus of kidney: Secondary | ICD-10-CM

## 2020-11-06 DIAGNOSIS — N201 Calculus of ureter: Secondary | ICD-10-CM

## 2020-11-06 DIAGNOSIS — R310 Gross hematuria: Secondary | ICD-10-CM

## 2020-11-06 LAB — URINALYSIS, COMPLETE (UACMP) WITH MICROSCOPIC
Bacteria, UA: NONE SEEN
Bilirubin Urine: NEGATIVE
Glucose, UA: NEGATIVE mg/dL
Ketones, ur: NEGATIVE mg/dL
Leukocytes,Ua: NEGATIVE
Nitrite: NEGATIVE
Protein, ur: 100 mg/dL — AB
RBC / HPF: >50 RBC/hpf — ABNORMAL HIGH (ref 0–5)
Specific Gravity, Urine: 1.018 (ref 1.005–1.030)
Squamous Epithelial / HPF: NONE SEEN (ref 0–5)
WBC, UA: NONE SEEN WBC/hpf (ref 0–5)
pH: 6 (ref 5.0–8.0)

## 2020-11-06 MED ORDER — ONDANSETRON HCL 4 MG/2ML IJ SOLN
4.0000 mg | INTRAMUSCULAR | Status: AC
  Administered 2020-11-06: 4 mg via INTRAVENOUS
  Filled 2020-11-06: qty 2

## 2020-11-06 MED ORDER — ONDANSETRON 4 MG PO TBDP: ORAL_TABLET | ORAL | 0 refills | Status: DC

## 2020-11-06 MED ORDER — DOCUSATE SODIUM 100 MG PO CAPS: ORAL_CAPSULE | ORAL | 0 refills | Status: DC

## 2020-11-06 MED ORDER — FENTANYL CITRATE PF 50 MCG/ML IJ SOSY
50.0000 ug | PREFILLED_SYRINGE | Freq: Once | INTRAMUSCULAR | Status: AC
  Administered 2020-11-06: 50 ug via INTRAVENOUS
  Filled 2020-11-06: qty 1

## 2020-11-06 MED ORDER — KETOROLAC TROMETHAMINE 30 MG/ML IJ SOLN
15.0000 mg | Freq: Once | INTRAMUSCULAR | Status: AC
  Administered 2020-11-06: 15 mg via INTRAVENOUS
  Filled 2020-11-06: qty 1

## 2020-11-06 MED ORDER — TAMSULOSIN HCL 0.4 MG PO CAPS: ORAL_CAPSULE | ORAL | 0 refills | Status: DC

## 2020-11-06 MED ORDER — HYDROCODONE-ACETAMINOPHEN 5-325 MG PO TABS
2.0000 | ORAL_TABLET | Freq: Once | ORAL | Status: AC
  Administered 2020-11-06: 2 via ORAL
  Filled 2020-11-06: qty 2

## 2020-11-06 MED ORDER — HYDROCODONE-ACETAMINOPHEN 5-325 MG PO TABS: 2.0000 | ORAL_TABLET | Freq: Four times a day (QID) | ORAL | 0 refills | Status: DC | PRN

## 2020-11-06 NOTE — Discharge Instructions (Addendum)
You have been seen in the Emergency Department (ED) today for pain caused by kidney stones.  As we have discussed, please drink plenty of fluids.  Please make a follow up appointment with the physician(s) listed elsewhere in this documentation.  You may take pain medication as needed but ONLY as prescribed.  Please also take your prescribed Flomax daily.  If you are going to follow up with Urology for possible lithotripsy, please do not take any ibuprofen, naproxen, aspirin, Toradol, or other NSAID after Tuesday morning, as this may exclude you from lithotripsy.  Please remember to discuss the bilateral renal cyst seen on CT scan with your urologist.  They may have additional information or recommend additional evaluation.  Please see your doctor as soon as possible as stones may take 1-3 weeks to pass and you may require additional care or medications.  Do not drink alcohol, drive or participate in any other potentially dangerous activities while taking opiate pain medication as it may make you sleepy. Do not take this medication with any other sedating medications, either prescription or over-the-counter. If you were prescribed Percocet or Vicodin, do not take these with acetaminophen (Tylenol) as it is already contained within these medications.   Take Norco as needed for severe pain.  This medication is an opiate (or narcotic) pain medication and can be habit forming.  Use it as little as possible to achieve adequate pain control.  Do not use or use it with extreme caution if you have a history of opiate abuse or dependence.  If you are on a pain contract with your primary care doctor or a pain specialist, be sure to let them know you were prescribed this medication today from the [facility] Emergency Department.  This medication is intended for your use only - do not give any to anyone else and keep it in a secure place where nobody else, especially children, have access to it.  It will also  cause or worsen constipation, so you may want to consider taking an over-the-counter stool softener while you are taking this medication.  Return to the Emergency Department (ED) or call your doctor if you have any worsening pain, fever, painful urination, are unable to urinate, or develop other symptoms that concern you.

## 2020-11-06 NOTE — ED Provider Notes (Signed)
[facility] Emergency Department Provider Note  ____________________________________________   Event Date/Time   First MD Initiated Contact with Patient 11/06/20 0045     (approximate)  I have reviewed the triage vital signs and the nursing notes.   HISTORY  Chief Complaint Flank Pain    HPI Curtis Brown. is a 45 y.o. male with history of prior kidney stones and presents for evaluation of a cute onset sharp and severe left flank pain that feels just like his prior kidney stones.  It was accompanied with nausea and persistent vomiting.  It went on for several hours before coming to the ED.  He felt better after getting some fentanyl but he said the pain is starting to come back a little bit before.  Nothing in particular made the pain better or worse.  He has had gross blood in his urine the last few times he has gone to the bathroom.  Prior to the onset of the pain he had no stinging or burning when he urinated.  He denies any recent fever, sore throat, chest pain, shortness of breath, and cough.  Nothing in particular makes the pain worse and the fentanyl made it better.  He is no longer nauseated after receiving Zofran in triage.     Past Medical History:  Diagnosis Date   Kidney stone     Patient Active Problem List   Diagnosis Date Noted   Unstable angina (HCC) 11/01/2017    Past Surgical History:  Procedure Laterality Date   HERNIA REPAIR     INGUINAL HERNIA REPAIR     LEG SURGERY      Prior to Admission medications   Medication Sig Start Date End Date Taking? Authorizing Provider  docusate sodium (COLACE) 100 MG capsule Take 1 tablet once or twice daily as needed for constipation while taking narcotic pain medicine 11/06/20  Yes Loleta Rose, MD  HYDROcodone-acetaminophen (NORCO/VICODIN) 5-325 MG tablet Take 2 tablets by mouth every 6 (six) hours as needed for moderate pain or severe pain. 11/06/20  Yes Loleta Rose, MD  ondansetron  (ZOFRAN ODT) 4 MG disintegrating tablet Allow 1-2 tablets to dissolve in your mouth every 8 hours as needed for nausea/vomiting 11/06/20  Yes Loleta Rose, MD  tamsulosin [facility]) 0.4 MG CAPS capsule Take 1 tablet by mouth daily until you pass the kidney stone or no longer have symptoms 11/06/20  Yes Loleta Rose, MD  losartan (COZAAR) 25 MG tablet Take 25 mg by mouth daily.    [provider]  methylPREDNISolone (MEDROL DOSEPAK) 4 MG TBPK tablet Take as directed on the packaging 08/21/19   Miguel Aschoff., MD  promethazine (PHENERGAN) 25 MG tablet Take 1 tablet (25 mg total) by mouth every 6 (six) hours as needed for nausea or vomiting. 06/08/20   Ward, Layla Maw, DO    Allergies Oxycodone-acetaminophen and Percocet [oxycodone-acetaminophen]  No family history on file.  Social History Social History   Tobacco Use   Smoking status: Never   Smokeless tobacco: Never  Substance Use Topics   Alcohol use: Yes    Comment: occassionally   Drug use: No    Review of Systems Constitutional: No fever/chills Eyes: No visual changes. ENT: No sore throat. Cardiovascular: Denies chest pain. Respiratory: Denies shortness of breath. Gastrointestinal: Persistent nausea and vomiting, now resolved.  Left-sided flank pain radiating into the left lower part of the abdomen.   \Genitourinary: Negative for dysuria. Musculoskeletal: Severe left lower flank pain. Integumentary: Negative  for rash. Neurological: Negative for headaches, focal weakness or numbness.   ____________________________________________   PHYSICAL EXAM:  VITAL SIGNS: ED Triage Vitals  Enc Vitals Group     BP 11/05/20 2108 (!) 162/105     Pulse Rate 11/05/20 2108 71     Resp 11/05/20 2108 20     Temp 11/05/20 2108 97.7 F (36.5 C)     Temp Source 11/05/20 2108 Oral     SpO2 11/05/20 2108 97 %     Weight 11/05/20 2108 90.7 kg (200 lb)     Height 11/05/20 2108 1.778 m (5\' 10" )     Head Circumference --      Peak Flow  --      Pain Score 11/05/20 2108 8     Pain Loc --      Pain Edu? --      Excl. in GC? --     Constitutional: Alert and oriented.  Eyes: Conjunctivae are normal.  Head: Atraumatic. Nose: No congestion/rhinnorhea. Mouth/Throat: Patient is wearing a mask. Neck: No stridor.  No meningeal signs.   Cardiovascular: Normal rate, regular rhythm. Good peripheral circulation. Respiratory: Normal respiratory effort.  No retractions. Gastrointestinal: Soft and nontender.  No distention. Musculoskeletal: Mild left flank tenderness to percussion. Neurologic:  Normal speech and language. No gross focal neurologic deficits are appreciated.  Skin:  Skin is warm, dry and intact. Psychiatric: Mood and affect are normal. Speech and behavior are normal.  ____________________________________________   LABS (all labs ordered are listed, but only abnormal results are displayed)  Labs Reviewed  URINALYSIS, COMPLETE (UACMP) WITH MICROSCOPIC - Abnormal; Notable for the following components:      Result Value   Color, Urine AMBER (*)    APPearance CLOUDY (*)    Hgb urine dipstick LARGE (*)    Protein, ur 100 (*)    RBC / HPF >50 (*)    All other components within normal limits  BASIC METABOLIC PANEL - Abnormal; Notable for the following components:   Potassium 3.3 (*)    Glucose, Bld 116 (*)    All other components within normal limits  URINE CULTURE  CBC   ____________________________________________   RADIOLOGY I, 2109, personally viewed and evaluated these images (plain radiographs) as part of my medical decision making, as well as reviewing the written report by the radiologist.  ED MD interpretation: 4 mm obstructing proximal left ureteral stone.  Bilateral hemorrhagic and nonhemorrhagic renal cysts.  Official radiology report(s): CT Renal Stone Study  Result Date: 11/05/2020 CLINICAL DATA:  Left flank pain. EXAM: CT ABDOMEN AND PELVIS WITHOUT CONTRAST TECHNIQUE: Multidetector CT  imaging of the abdomen and pelvis was performed following the standard protocol without IV contrast. COMPARISON:  August 21, 2019 FINDINGS: Lower chest: No acute abnormality. Hepatobiliary: No focal liver abnormality is seen. No gallstones, gallbladder wall thickening, or biliary dilatation. Pancreas: Unremarkable. No pancreatic ductal dilatation or surrounding inflammatory changes. Spleen: Normal in size without focal abnormality. Adrenals/Urinary Tract: Adrenal glands are unremarkable. Kidneys are normal in size. A 4 mm obstructing renal stone is seen within the proximal left ureter, with mild left-sided hydronephrosis and hydroureter. Small, predominantly stable bilateral hemorrhagic and nonhemorrhagic renal cysts are seen. Bladder is unremarkable. Stomach/Bowel: Stomach is within normal limits. Appendix appears normal. No evidence of bowel wall thickening, distention, or inflammatory changes. Vascular/Lymphatic: No significant vascular findings are present. No enlarged abdominal or pelvic lymph nodes. Reproductive: Prostate is unremarkable. Other: Multiple surgical coils are seen within the anterior aspect  of the mid abdomen, along the midline. No abdominopelvic ascites. Musculoskeletal: No acute or significant osseous findings. IMPRESSION: 1. 4 mm obstructing renal stone within the proximal left ureter. 2. Bilateral hemorrhagic and nonhemorrhagic renal cysts. Electronically Signed   By: Aram Candela M.D.   On: 11/05/2020 22:24    ____________________________________________   PROCEDURES   Procedure(s) performed (including Critical Care):  Procedures   ____________________________________________   INITIAL IMPRESSION / MDM / ASSESSMENT AND PLAN / ED COURSE  As part of my medical decision making, I reviewed the following data within the electronic MEDICAL RECORD NUMBER Nursing notes reviewed and incorporated, Labs reviewed , Old chart reviewed, Notes from prior ED visits, and [place] Controlled  Substance Database   Differential diagnosis includes, but is not limited to, renal/ureteral stone, UTI/pyelonephritis, infected stone, trauma, polycystic kidney disease.  Vital signs are stable and within normal limits.  Patient feels much better than he did before though he still feels some discomfort.  Basic metabolic panel and CBC are within normal limits.  Patient has provided a urine specimen but it has not yet been sent to the lab.  I discussed the results of the CT scan with him including the 4 mm stone and the bilateral renal cysts.  He was unaware of any cystic condition of his kidneys and I encouraged him to follow-up as an outpatient with the urologist.  The current plan is to check a urinalysis to make sure there is no evidence of infection and to treat as an outpatient appropriately unless he has an obvious gross infection in the urine that requires a ureteral stent.  However he appears systemically well with no evidence of sepsis and I suspect he can be treated with close follow-up and outpatient antibiotics and analgesia.  He agrees with the plan.       Clinical Course as of 11/06/20 0315  Fri Nov 06, 2020  0314 Urinalysis, Complete w Microscopic(!) Normal urinalysis except for hematuria, no indication of infection.  Will discharge as previously discussed for outpatient follow-up. [CF]    Clinical Course User Index [CF] Loleta Rose, MD     ____________________________________________  FINAL CLINICAL IMPRESSION(S) / ED DIAGNOSES  Final diagnoses:  Left ureteral stone  Kidney stone  Bilateral renal cysts  Gross hematuria     MEDICATIONS GIVEN DURING THIS VISIT:  Medications  ondansetron (ZOFRAN) injection 4 mg (4 mg Intravenous Given 11/05/20 2125)  ketorolac (TORADOL) 30 MG/ML injection 15 mg (15 mg Intravenous Given 11/06/20 0224)  HYDROcodone-acetaminophen (NORCO/VICODIN) 5-325 MG per tablet 2 tablet (2 tablets Oral Given 11/06/20 0225)  fentaNYL (SUBLIMAZE)  injection 50 mcg (50 mcg Intravenous Given 11/06/20 0225)  ondansetron (ZOFRAN) injection 4 mg (4 mg Intravenous Given 11/06/20 0227)     ED Discharge Orders          Ordered    HYDROcodone-acetaminophen (NORCO/VICODIN) 5-325 MG tablet  Every 6 hours PRN        11/06/20 0222    ondansetron (ZOFRAN ODT) 4 MG disintegrating tablet        11/06/20 0222    tamsulosin (FLOMAX) 0.4 MG CAPS capsule        11/06/20 0222    docusate sodium (COLACE) 100 MG capsule        11/06/20 0222             Note:  This document was prepared using Dragon voice recognition software and may include unintentional dictation errors.   Loleta Rose, MD 11/06/20 [phone]

## 2020-11-07 ENCOUNTER — Observation Stay: Payer: PRIVATE HEALTH INSURANCE

## 2020-11-07 ENCOUNTER — Encounter: Payer: Self-pay | Admitting: Emergency Medicine

## 2020-11-07 ENCOUNTER — Observation Stay
Admission: EM | Admit: 2020-11-07 | Discharge: 2020-11-08 | Disposition: A | Discharge status: Home / Self Care | Payer: PRIVATE HEALTH INSURANCE | Attending: Urology | Admitting: Urology

## 2020-11-07 ENCOUNTER — Other Ambulatory Visit: Payer: Self-pay

## 2020-11-07 ENCOUNTER — Emergency Department: Payer: PRIVATE HEALTH INSURANCE

## 2020-11-07 ENCOUNTER — Encounter
Admission: EM | Disposition: A | Discharge status: Home / Self Care | Payer: Self-pay | Source: Home / Self Care | Attending: Emergency Medicine

## 2020-11-07 ENCOUNTER — Observation Stay: Payer: PRIVATE HEALTH INSURANCE | Admitting: Anesthesiology

## 2020-11-07 DIAGNOSIS — N201 Calculus of ureter: Secondary | ICD-10-CM

## 2020-11-07 DIAGNOSIS — N23 Unspecified renal colic: Secondary | ICD-10-CM

## 2020-11-07 DIAGNOSIS — Z20822 Contact with and (suspected) exposure to covid-19: Secondary | ICD-10-CM | POA: Insufficient documentation

## 2020-11-07 DIAGNOSIS — R109 Unspecified abdominal pain: Secondary | ICD-10-CM

## 2020-11-07 HISTORY — PX: CYSTOSCOPY W/ URETERAL STENT PLACEMENT: SHX1429

## 2020-11-07 LAB — BASIC METABOLIC PANEL
Anion gap: 7 (ref 5–15)
Anion gap: 6 (ref 5–15)
BUN: 18 mg/dL (ref 6–20)
BUN: 17 mg/dL (ref 6–20)
CO2: 26 mmol/L (ref 22–32)
CO2: 29 mmol/L (ref 22–32)
Calcium: 8.5 mg/dL — ABNORMAL LOW (ref 8.9–10.3)
Calcium: 8.6 mg/dL — ABNORMAL LOW (ref 8.9–10.3)
Chloride: 104 mmol/L (ref 98–111)
Chloride: 101 mmol/L (ref 98–111)
Creatinine, Ser: 1.27 mg/dL — ABNORMAL HIGH (ref 0.61–1.24)
Creatinine, Ser: 1.31 mg/dL — ABNORMAL HIGH (ref 0.61–1.24)
GFR, Estimated: >60 mL/min (ref >60)
GFR, Estimated: >60 mL/min (ref >60)
Glucose, Bld: 107 mg/dL — ABNORMAL HIGH (ref 70–99)
Glucose, Bld: 112 mg/dL — ABNORMAL HIGH (ref 70–99)
Potassium: 3.4 mmol/L — ABNORMAL LOW (ref 3.5–5.1)
Potassium: 3.8 mmol/L (ref 3.5–5.1)
Sodium: 137 mmol/L (ref 135–145)
Sodium: 136 mmol/L (ref 135–145)

## 2020-11-07 LAB — URINALYSIS, COMPLETE (UACMP) WITH MICROSCOPIC
Bacteria, UA: NONE SEEN
Bilirubin Urine: NEGATIVE
Glucose, UA: NEGATIVE mg/dL
Ketones, ur: NEGATIVE mg/dL
Leukocytes,Ua: NEGATIVE
Nitrite: NEGATIVE
RBC / HPF: >50 RBC/hpf — ABNORMAL HIGH (ref 0–5)
Specific Gravity, Urine: 1.025 (ref 1.005–1.030)
Squamous Epithelial / HPF: NONE SEEN (ref 0–5)
pH: 6 (ref 5.0–8.0)

## 2020-11-07 LAB — RESP PANEL BY RT-PCR (FLU A&B, COVID) ARPGX2
Influenza A by PCR: NEGATIVE
Influenza B by PCR: NEGATIVE
SARS Coronavirus 2 by RT PCR: NEGATIVE

## 2020-11-07 LAB — CBC WITH DIFFERENTIAL/PLATELET
Abs Immature Granulocytes: 0.03 10*3/uL (ref 0.00–0.07)
Basophils Absolute: 0 10*3/uL (ref 0.0–0.1)
Basophils Relative: 1 %
Eosinophils Absolute: 0.1 10*3/uL (ref 0.0–0.5)
Eosinophils Relative: 1 %
HCT: 41.2 % (ref 39.0–52.0)
Hemoglobin: 14.8 g/dL (ref 13.0–17.0)
Immature Granulocytes: 0 %
Lymphocytes Relative: 8 %
Lymphs Abs: 0.7 10*3/uL (ref 0.7–4.0)
MCH: 30.6 pg (ref 26.0–34.0)
MCHC: 35.9 g/dL (ref 30.0–36.0)
MCV: 85.1 fL (ref 80.0–100.0)
Monocytes Absolute: 0.5 10*3/uL (ref 0.1–1.0)
Monocytes Relative: 6 %
Neutro Abs: 7.1 10*3/uL (ref 1.7–7.7)
Neutrophils Relative %: 84 %
Platelets: 153 10*3/uL (ref 150–400)
RBC: 4.84 MIL/uL (ref 4.22–5.81)
RDW: 12.2 % (ref 11.5–15.5)
WBC: 8.4 10*3/uL (ref 4.0–10.5)
nRBC: 0 % (ref 0.0–0.2)

## 2020-11-07 LAB — URINE CULTURE: Culture: NO GROWTH

## 2020-11-07 IMAGING — DX DG ABDOMEN 1V
2 series · 2 of 2 positions shown · non-contrast
Comparison: CT [DATE].

CLINICAL DATA: Worsening left flank pain with nausea and vomiting.

EXAM:
ABDOMEN - 1 VIEW

[abdomen supine (1 of 2)]
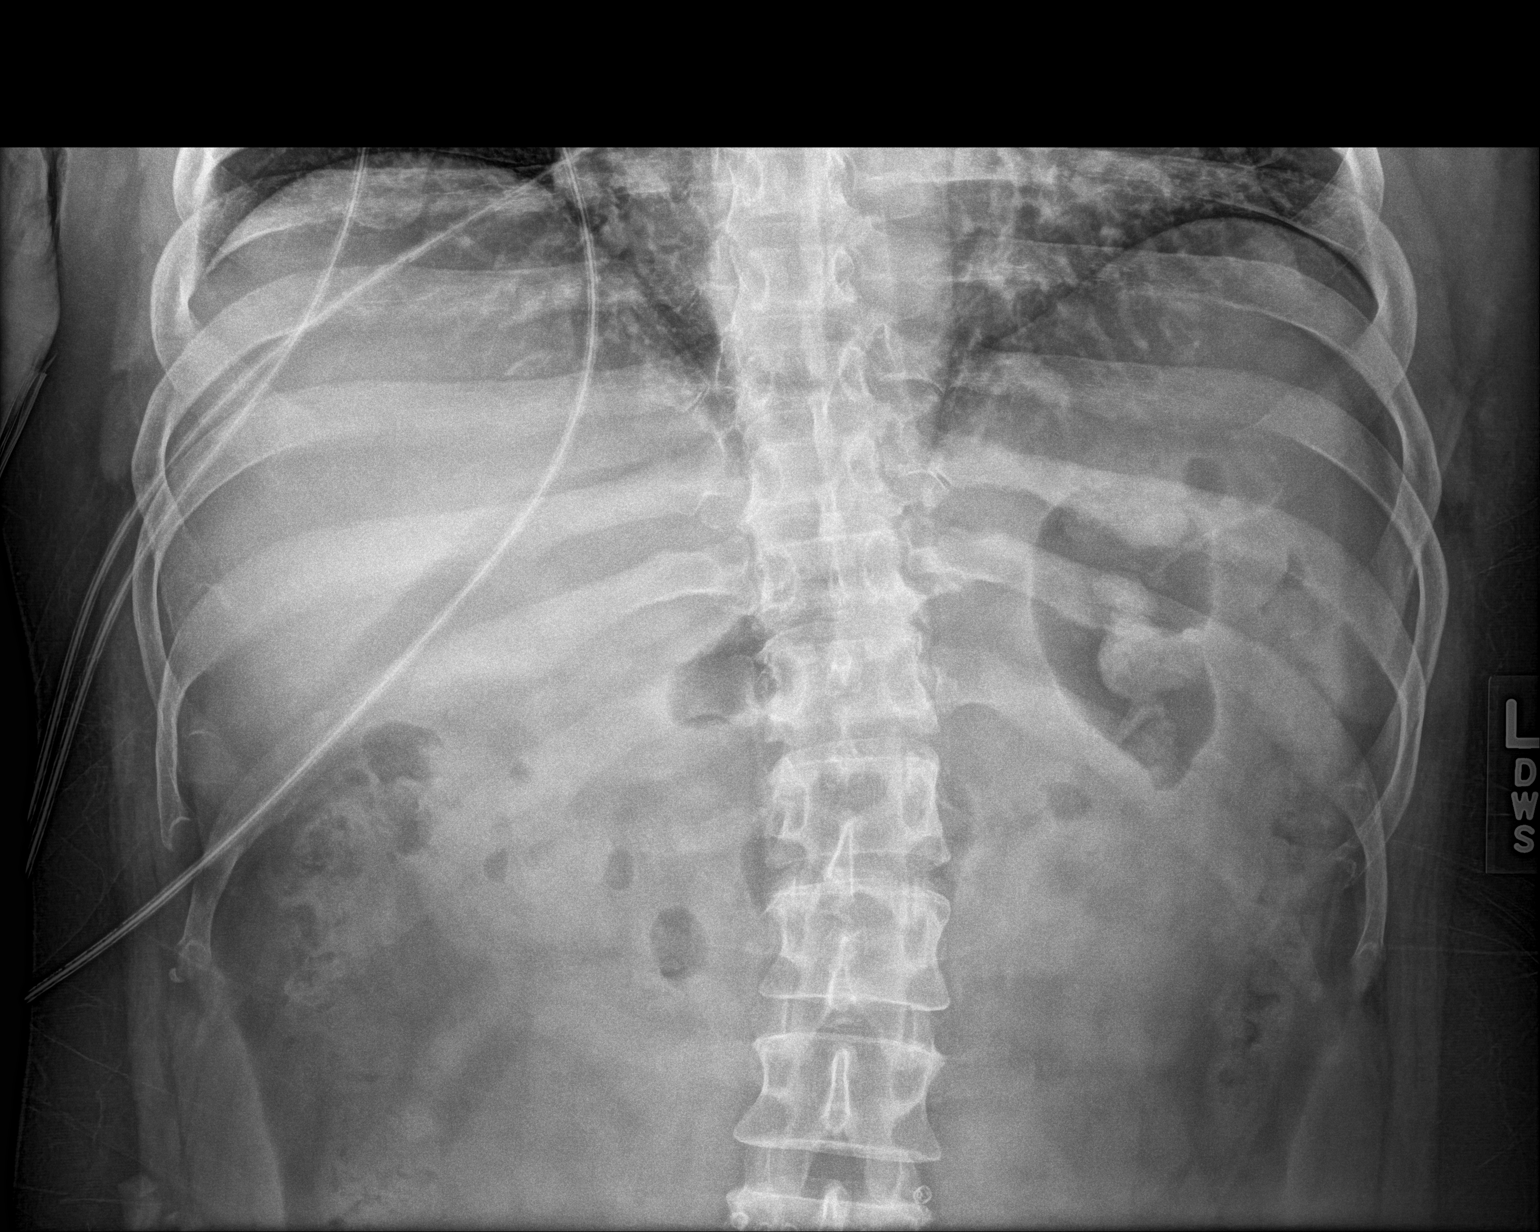

[abdomen supine (2 of 2)]
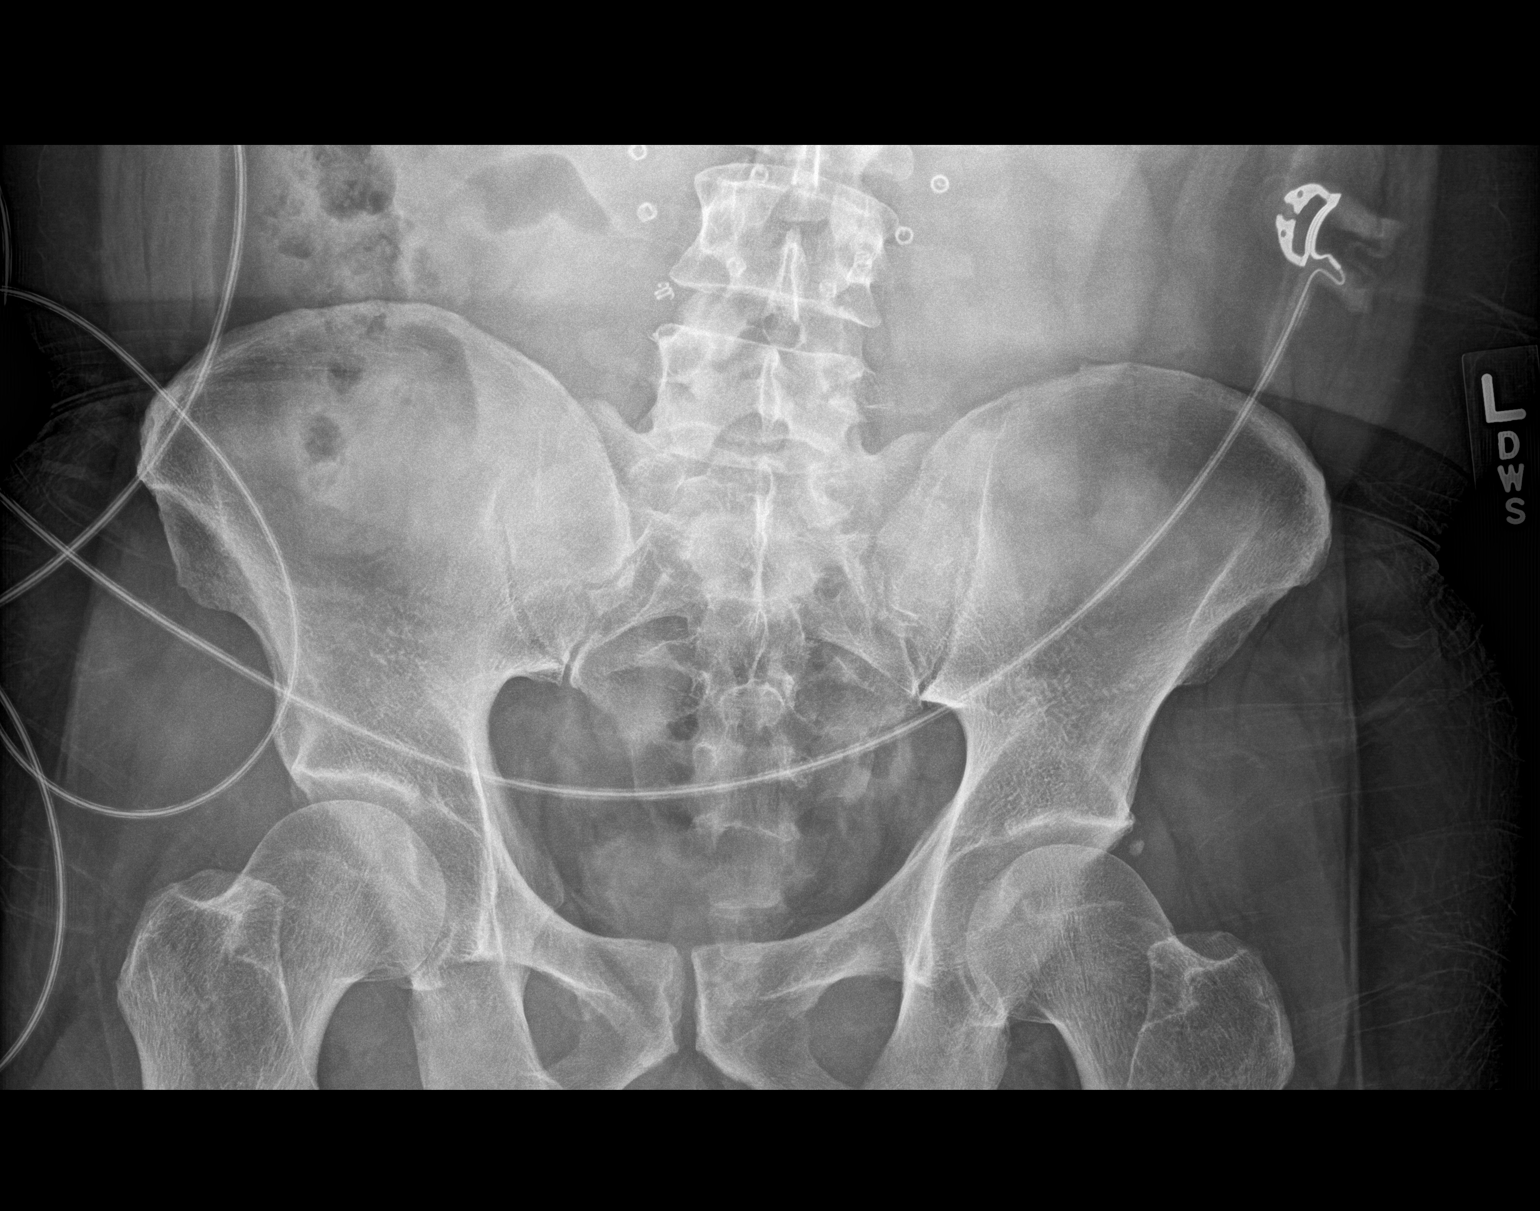

[2 of 2 positions shown; findings below may reference images not displayed]

FINDINGS: The bowel gas pattern is normal. No radio-opaque calculi. Ventral
hernia repair tacks.
IMPRESSION: No radiopaque renal calculus visualized.

Nonobstructive bowel gas pattern.

## 2020-11-07 SURGERY — CYSTOSCOPY, WITH RETROGRADE PYELOGRAM AND URETERAL STENT INSERTION
Anesthesia: General | Laterality: Left

## 2020-11-07 MED ORDER — DEXAMETHASONE SODIUM PHOSPHATE 10 MG/ML IJ SOLN
INTRAMUSCULAR | Status: DC | PRN
  Administered 2020-11-07: 10 mg via INTRAVENOUS

## 2020-11-07 MED ORDER — PROPOFOL 500 MG/50ML IV EMUL
INTRAVENOUS | Status: AC
  Filled 2020-11-07: qty 50

## 2020-11-07 MED ORDER — PROPOFOL 10 MG/ML IV BOLUS
INTRAVENOUS | Status: DC | PRN
  Administered 2020-11-07: 50 mg via INTRAVENOUS
  Administered 2020-11-07: 200 mg via INTRAVENOUS

## 2020-11-07 MED ORDER — FLEET ENEMA 7-19 GM/118ML RE ENEM: 1.0000 | ENEMA | Freq: Once | RECTAL | Status: DC | PRN

## 2020-11-07 MED ORDER — POTASSIUM CHLORIDE IN NACL 20-0.45 MEQ/L-% IV SOLN
INTRAVENOUS | Status: DC
  Filled 2020-11-07 (×3): qty 1000

## 2020-11-07 MED ORDER — MIDAZOLAM HCL 2 MG/2ML IJ SOLN
INTRAMUSCULAR | Status: DC | PRN
  Administered 2020-11-07: 2 mg via INTRAVENOUS

## 2020-11-07 MED ORDER — LACTATED RINGERS IV BOLUS
1000.0000 mL | Freq: Once | INTRAVENOUS | Status: AC
  Administered 2020-11-07: 1000 mL via INTRAVENOUS

## 2020-11-07 MED ORDER — DROPERIDOL 2.5 MG/ML IJ SOLN: 0.6250 mg | Freq: Once | INTRAMUSCULAR | Status: DC | PRN

## 2020-11-07 MED ORDER — FENTANYL CITRATE PF 50 MCG/ML IJ SOSY
75.0000 ug | PREFILLED_SYRINGE | Freq: Once | INTRAMUSCULAR | Status: AC
  Administered 2020-11-07: 75 ug via INTRAVENOUS
  Filled 2020-11-07: qty 2

## 2020-11-07 MED ORDER — ACETAMINOPHEN 10 MG/ML IV SOLN
INTRAVENOUS | Status: AC
  Filled 2020-11-07: qty 100

## 2020-11-07 MED ORDER — ONDANSETRON HCL 4 MG/2ML IJ SOLN
4.0000 mg | Freq: Once | INTRAMUSCULAR | Status: AC
  Administered 2020-11-07: 4 mg via INTRAVENOUS
  Filled 2020-11-07: qty 2

## 2020-11-07 MED ORDER — TAMSULOSIN HCL 0.4 MG PO CAPS
0.4000 mg | ORAL_CAPSULE | Freq: Every day | ORAL | Status: DC
  Administered 2020-11-08: 0.4 mg via ORAL
  Filled 2020-11-07: qty 1

## 2020-11-07 MED ORDER — ACETAMINOPHEN 10 MG/ML IV SOLN
1000.0000 mg | Freq: Once | INTRAVENOUS | Status: DC | PRN
  Administered 2020-11-07: 1000 mg via INTRAVENOUS

## 2020-11-07 MED ORDER — FENTANYL CITRATE (PF) 100 MCG/2ML IJ SOLN
INTRAMUSCULAR | Status: AC
  Filled 2020-11-07: qty 2

## 2020-11-07 MED ORDER — HYOSCYAMINE SULFATE 0.125 MG SL SUBL
0.1250 mg | SUBLINGUAL_TABLET | SUBLINGUAL | Status: DC | PRN
  Filled 2020-11-07: qty 1

## 2020-11-07 MED ORDER — ONDANSETRON HCL 4 MG/2ML IJ SOLN: 4.0000 mg | INTRAMUSCULAR | Status: DC | PRN

## 2020-11-07 MED ORDER — HYDROMORPHONE HCL 1 MG/ML IJ SOLN
1.0000 mg | Freq: Once | INTRAMUSCULAR | Status: AC
  Administered 2020-11-07: 1 mg via INTRAVENOUS
  Filled 2020-11-07: qty 1

## 2020-11-07 MED ORDER — DOCUSATE SODIUM 100 MG PO CAPS
100.0000 mg | ORAL_CAPSULE | Freq: Two times a day (BID) | ORAL | Status: DC
  Administered 2020-11-07: 100 mg via ORAL
  Filled 2020-11-07 (×2): qty 1

## 2020-11-07 MED ORDER — BELLADONNA ALKALOIDS-OPIUM 16.2-60 MG RE SUPP
RECTAL | Status: AC
  Filled 2020-11-07: qty 1

## 2020-11-07 MED ORDER — ZOLPIDEM TARTRATE 5 MG PO TABS: 5.0000 mg | ORAL_TABLET | Freq: Every evening | ORAL | Status: DC | PRN

## 2020-11-07 MED ORDER — SUCCINYLCHOLINE CHLORIDE 200 MG/10ML IV SOSY
PREFILLED_SYRINGE | INTRAVENOUS | Status: DC | PRN
  Administered 2020-11-07: 100 mg via INTRAVENOUS

## 2020-11-07 MED ORDER — BISACODYL 10 MG RE SUPP: 10.0000 mg | Freq: Every day | RECTAL | Status: DC | PRN

## 2020-11-07 MED ORDER — FENTANYL CITRATE (PF) 100 MCG/2ML IJ SOLN
INTRAMUSCULAR | Status: DC | PRN
  Administered 2020-11-07 (×3): 50 ug via INTRAVENOUS

## 2020-11-07 MED ORDER — BELLADONNA ALKALOIDS-OPIUM 16.2-60 MG RE SUPP
RECTAL | Status: DC | PRN
  Administered 2020-11-07: 1 via RECTAL

## 2020-11-07 MED ORDER — DIPHENHYDRAMINE HCL 50 MG/ML IJ SOLN
INTRAMUSCULAR | Status: DC | PRN
  Administered 2020-11-07: 25 mg via INTRAVENOUS

## 2020-11-07 MED ORDER — LACTATED RINGERS IV SOLN: INTRAVENOUS | Status: DC | PRN

## 2020-11-07 MED ORDER — CEFAZOLIN SODIUM-DEXTROSE 2-4 GM/100ML-% IV SOLN
2.0000 g | Freq: Once | INTRAVENOUS | Status: AC
  Administered 2020-11-07: 2 g via INTRAVENOUS

## 2020-11-07 MED ORDER — HYDROMORPHONE HCL 1 MG/ML IJ SOLN: 0.5000 mg | INTRAMUSCULAR | Status: DC | PRN

## 2020-11-07 MED ORDER — CEFAZOLIN SODIUM-DEXTROSE 2-4 GM/100ML-% IV SOLN
INTRAVENOUS | Status: AC
  Filled 2020-11-07: qty 100

## 2020-11-07 MED ORDER — FENTANYL CITRATE (PF) 100 MCG/2ML IJ SOLN: 25.0000 ug | INTRAMUSCULAR | Status: DC | PRN

## 2020-11-07 MED ORDER — PROPOFOL 10 MG/ML IV BOLUS
INTRAVENOUS | Status: AC
  Filled 2020-11-07: qty 20

## 2020-11-07 MED ORDER — ACETAMINOPHEN 325 MG PO TABS: 650.0000 mg | ORAL_TABLET | ORAL | Status: DC | PRN

## 2020-11-07 MED ORDER — KETOROLAC TROMETHAMINE 30 MG/ML IJ SOLN
15.0000 mg | Freq: Once | INTRAMUSCULAR | Status: AC
  Administered 2020-11-07: 15 mg via INTRAVENOUS
  Filled 2020-11-07: qty 1

## 2020-11-07 MED ORDER — SUCCINYLCHOLINE CHLORIDE 200 MG/10ML IV SOSY
PREFILLED_SYRINGE | INTRAVENOUS | Status: AC
  Filled 2020-11-07: qty 10

## 2020-11-07 MED ORDER — SENNOSIDES-DOCUSATE SODIUM 8.6-50 MG PO TABS: 1.0000 | ORAL_TABLET | Freq: Every evening | ORAL | Status: DC | PRN

## 2020-11-07 MED ORDER — LIDOCAINE HCL (PF) 2 % IJ SOLN
INTRAMUSCULAR | Status: AC
  Filled 2020-11-07: qty 5

## 2020-11-07 MED ORDER — SODIUM CHLORIDE 0.9 % IV SOLN
1.5000 mg/kg | Freq: Once | INTRAVENOUS | Status: AC
  Administered 2020-11-07: 136 mg via INTRAVENOUS
  Filled 2020-11-07: qty 6.8

## 2020-11-07 MED ORDER — SODIUM CHLORIDE 0.9 % IV SOLN
12.5000 mg | Freq: Once | INTRAVENOUS | Status: AC
  Administered 2020-11-07: 12.5 mg via INTRAVENOUS
  Filled 2020-11-07: qty 12.5

## 2020-11-07 MED ORDER — PROPOFOL 500 MG/50ML IV EMUL
INTRAVENOUS | Status: DC | PRN
  Administered 2020-11-07: 200 ug/kg/min via INTRAVENOUS

## 2020-11-07 MED ORDER — IOHEXOL 180 MG/ML  SOLN
INTRAMUSCULAR | Status: DC | PRN
  Administered 2020-11-07: 5 mL

## 2020-11-07 MED ORDER — MIDAZOLAM HCL 2 MG/2ML IJ SOLN
INTRAMUSCULAR | Status: AC
  Filled 2020-11-07: qty 2

## 2020-11-07 MED ORDER — FENTANYL CITRATE PF 50 MCG/ML IJ SOSY: 50.0000 ug | PREFILLED_SYRINGE | Freq: Once | INTRAMUSCULAR | Status: DC

## 2020-11-07 MED ORDER — MORPHINE SULFATE (PF) 2 MG/ML IV SOLN: 2.0000 mg | INTRAVENOUS | Status: DC | PRN

## 2020-11-07 SURGICAL SUPPLY — 18 items
BAG DRAIN CYSTO-URO LG1000N (MISCELLANEOUS) ×2 IMPLANT
CATH URETL OPEN 5X70 (CATHETERS) ×2 IMPLANT
GAUZE 4X4 16PLY ~~LOC~~+RFID DBL (SPONGE) ×4 IMPLANT
GLOVE SURG ENC MOIS LTX SZ8 (GLOVE) ×2 IMPLANT
GOWN STRL REUS W/ TWL LRG LVL4 (GOWN DISPOSABLE) ×1 IMPLANT
GOWN STRL REUS W/ TWL XL LVL3 (GOWN DISPOSABLE) ×1 IMPLANT
GOWN STRL REUS W/TWL LRG LVL4 (GOWN DISPOSABLE) ×2
GOWN STRL REUS W/TWL XL LVL3 (GOWN DISPOSABLE) ×2
GUIDEWIRE STR DUAL SENSOR (WIRE) ×4 IMPLANT
IV NS IRRIG 3000ML ARTHROMATIC (IV SOLUTION) ×2 IMPLANT
KIT TURNOVER CYSTO (KITS) ×2 IMPLANT
MANIFOLD NEPTUNE II (INSTRUMENTS) ×2 IMPLANT
PACK CYSTO AR (MISCELLANEOUS) ×2 IMPLANT
SET CYSTO W/LG BORE CLAMP LF (SET/KITS/TRAYS/PACK) ×2 IMPLANT
STENT URET 6FRX24 CONTOUR (STENTS) IMPLANT
STENT URET 6FRX26 CONTOUR (STENTS) ×2 IMPLANT
WATER STERILE IRR 1000ML POUR (IV SOLUTION) ×2 IMPLANT
WATER STERILE IRR 500ML POUR (IV SOLUTION) ×2 IMPLANT

## 2020-11-07 NOTE — Op Note (Signed)
Procedure: 1.  Cystoscopy with left retrograde pyelogram and interpretation. 2.  Left ureteroscopy. 3.  Cystoscopy with insertion of left double-J stent. 4.  Application of fluoroscopy.  Preop diagnosis: 4 mm left ureteral stone with intractable pain.  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  Surgeon: Dr. Bjorn Pippin.  Anesthesia: General.  Drain: 6 French by 26 cm left contour double-J stent.  Specimen: None.   EBL: None.  Complications: None.  Indications: The patient is a 45 year old male who initially presented yesterday with severe left flank pain, nausea vomiting and hematuria.  He was seen in the emergency room and a CT scan demonstrated a 4 mm left proximal ureteral stone with hydronephrosis.  His pain was controlled and he was discharged home, but he was back in the ER today with intractable pain that could not be controlled despite multiple modalities.  A KUB was obtained and the stone was not clearly seen.  It was felt that cystoscopy with left retrograde pyelography, possible ureteroscopy laser and left ureteral stent insertion were indicated.  Procedure: He was given 2 g of Ancef.  A general anesthetic was induced.  He was placed in lithotomy position.  He was prepped with Betadine scrub and draped in usual sterile fashion.  Cystoscopy was performed and 21 [place] scope and 30 degree lens.  Examination revealed a normal urethra.  The external sphincter was intact.  The prostatic urethra was short with mild lateral lobe hyperplasia and some bladder neck elevation but minimal obstruction.  The bladder wall had mild trabeculation.  No mucosal lesions were seen.  Ureteral orifices were unremarkable with the exception of some blood at the left ureteral orifices and at base of the bladder.  A left retrograde pyelogram was performed with a 5 [place] open-ended catheter and Omnipaque.  The left retrograde pyelogram revealed somewhat delicate but otherwise normal  caliber ureter from the bladder up to the UPJ where there was a questionable small filling defect and some mild proximal dilation of the collecting system and calyces.  A sensor wire was passed to the kidney without difficulty.  The cystoscope was removed and a 12 [place] inner core of the digital access sheath was placed to the proximal ureter without significant difficulty.  The assembled sheath was then passed but could not be placed as far proximally.  The inner core was removed leaving the wire in place as a safety wire.  The single-lumen digital flexible ureteroscope was passed alongside the wire through the sheath.  In the area of the proximal ureter the mucosa was slightly pale and suggestive of a stricture.  I could not get the scope past this area either alongside the wire or over the wire.  The stone was not visualized and was likely proximal to this point.  The sheath was then removed along with the ureteroscope with visual inspection of the ureter on removal.  No significant mucosal injury was identified.  The cystoscope was then inserted over the wire and a 6 [place] by 26 cm contour double-J stent was advanced the kidney under fluoroscopic guidance.  The wire was removed, leaving a good coil in the kidney and a good coil in the bladder.  The bladder was drained and the cystoscope was removed.  A B&O suppository was placed.  He was taken down from lithotomy position, his anesthetic was reversed and he was moved to recovery room in stable condition.  There were no complications.  He will need ureteroscopy in a week or 2  to give the ureter a chance to self dilate.

## 2020-11-07 NOTE — Consult Note (Signed)
Subjective: 1. Ureteral colic   2. Left flank pain      Consult requested by Dr. Eric Form.   Curtis Brown is a 45 yo male who I was asked to see for intractable pain with a 7mm left ureteral stone that was proximal with mild obstruction on CT on 9/2.  He had severe pain with hematuria and nausea and vomiting but got initial relief and was sent home last night but returned today.   A KUB doesn't clearly show the stone but he has persistent microhematuria.   His pain persists despite multiple interventions.   He passed a stone last year but has had no GU surgery or history of UTI's.  ROS:  Review of Systems  Gastrointestinal:  Positive for abdominal pain, nausea and vomiting.  Genitourinary:  Positive for flank pain and hematuria.  All other systems reviewed and are negative.  Allergies  Allergen Reactions   Oxycodone-Acetaminophen Hives    Can take Tylenol    Percocet [Oxycodone-Acetaminophen] Hives    Past Medical History:  Diagnosis Date   Kidney stone     Past Surgical History:  Procedure Laterality Date   HERNIA REPAIR     INGUINAL HERNIA REPAIR     LEG SURGERY      Social History   Socioeconomic History   Marital status: Single    Spouse name: Not on file   Number of children: Not on file   Years of education: Not on file   Highest education level: Not on file  Occupational History   Not on file  Tobacco Use   Smoking status: Never   Smokeless tobacco: Never  Substance and Sexual Activity   Alcohol use: Yes    Comment: occassionally   Drug use: No   Sexual activity: Not on file  Other Topics Concern   Not on file  Social History Narrative   Not on file   Social Determinants of Health   Financial Resource Strain: Not on file  Food Insecurity: Not on file  Transportation Needs: Not on file  Physical Activity: Not on file  Stress: Not on file  Social Connections: Not on file  Intimate Partner Violence: Not on file    No family history on  file.  Anti-infectives: Anti-infectives (From admission, onward)    None       No current facility-administered medications for this encounter.   Current Outpatient Medications  Medication Sig Dispense Refill   acetaminophen (TYLENOL) 325 MG tablet Take 650 mg by mouth every 6 (six) hours as needed.     docusate sodium (COLACE) 100 MG capsule Take 1 tablet once or twice daily as needed for constipation while taking narcotic pain medicine 30 capsule 0   HYDROcodone-acetaminophen (NORCO/VICODIN) 5-325 MG tablet Take 2 tablets by mouth every 6 (six) hours as needed for moderate pain or severe pain. 30 tablet 0   tamsulosin (FLOMAX) 0.4 MG CAPS capsule Take 1 tablet by mouth daily until you pass the kidney stone or no longer have symptoms 14 capsule 0   losartan (COZAAR) 25 MG tablet Take 25 mg by mouth daily. (Patient not taking: Reported on 11/07/2020)     methylPREDNISolone (MEDROL DOSEPAK) 4 MG TBPK tablet Take as directed on the packaging (Patient not taking: Reported on 11/07/2020) 21 each 0   ondansetron (ZOFRAN ODT) 4 MG disintegrating tablet Allow 1-2 tablets to dissolve in your mouth every 8 hours as needed for nausea/vomiting (Patient not taking: Reported on 11/07/2020) 30 tablet 0  promethazine (PHENERGAN) 25 MG tablet Take 1 tablet (25 mg total) by mouth every 6 (six) hours as needed for nausea or vomiting. (Patient not taking: Reported on 11/07/2020) 15 tablet 0     Objective: Vital signs in last 24 hours: BP 128/81   Pulse 61   Temp 98 F (36.7 C) (Oral)   Resp 11   Ht 5\' 10"  (1.778 m)   Wt 90.7 kg   SpO2 95%   BMI 28.70 kg/m   Intake/Output from previous day: No intake/output data recorded. Intake/Output this shift: No intake/output data recorded.   Physical Exam Vitals reviewed.  Constitutional:      General: He is in acute distress.     Appearance: Normal appearance.  Cardiovascular:     Rate and Rhythm: Normal rate and regular rhythm.     Heart sounds:  Normal heart sounds.  Pulmonary:     Effort: Pulmonary effort is normal. No respiratory distress.     Breath sounds: Normal breath sounds.  Abdominal:     Palpations: Abdomen is soft.     Tenderness: There is left CVA tenderness.  Musculoskeletal:        General: Normal range of motion.  Skin:    General: Skin is warm and dry.  Neurological:     General: No focal deficit present.     Mental Status: He is alert and oriented to person, place, and time.  Psychiatric:        Mood and Affect: Mood normal.        Behavior: Behavior normal.    Lab Results:  Results for orders placed or performed during the hospital encounter of 11/07/20 (from the past 24 hour(s))  Urinalysis, Complete w Microscopic Nasopharyngeal Swab     Status: Abnormal   Collection Time: 11/07/20  4:19 PM  Result Value Ref Range   Color, Urine YELLOW YELLOW   APPearance HAZY (A) CLEAR   Specific Gravity, Urine 1.025 1.005 - 1.030   pH 6.0 5.0 - 8.0   Glucose, UA NEGATIVE NEGATIVE mg/dL   Hgb urine dipstick LARGE (A) NEGATIVE   Bilirubin Urine NEGATIVE NEGATIVE   Ketones, ur NEGATIVE NEGATIVE mg/dL   Protein, ur TRACE (A) NEGATIVE mg/dL   Nitrite NEGATIVE NEGATIVE   Leukocytes,Ua NEGATIVE NEGATIVE   RBC / HPF >50 (H) 0 - 5 RBC/hpf   WBC, UA 6-10 0 - 5 WBC/hpf   Bacteria, UA NONE SEEN NONE SEEN   Squamous Epithelial / LPF NONE SEEN 0 - 5   Mucus PRESENT   CBC with Differential     Status: None   Collection Time: 11/07/20  5:05 PM  Result Value Ref Range   WBC 8.4 4.0 - 10.5 K/uL   RBC 4.84 4.22 - 5.81 MIL/uL   Hemoglobin 14.8 13.0 - 17.0 g/dL   HCT 16.141.2 09.639.0 - 04.552.0 %   MCV 85.1 80.0 - 100.0 fL   MCH 30.6 26.0 - 34.0 pg   MCHC 35.9 30.0 - 36.0 g/dL   RDW 40.912.2 81.111.5 - 91.415.5 %   Platelets 153 150 - 400 K/uL   nRBC 0.0 0.0 - 0.2 %   Neutrophils Relative % 84 %   Neutro Abs 7.1 1.7 - 7.7 K/uL   Lymphocytes Relative 8 %   Lymphs Abs 0.7 0.7 - 4.0 K/uL   Monocytes Relative 6 %   Monocytes Absolute 0.5 0.1 -  1.0 K/uL   Eosinophils Relative 1 %   Eosinophils Absolute 0.1 0.0 - 0.5 K/uL  Basophils Relative 1 %   Basophils Absolute 0.0 0.0 - 0.1 K/uL   Immature Granulocytes 0 %   Abs Immature Granulocytes 0.03 0.00 - 0.07 K/uL  Basic metabolic panel     Status: Abnormal   Collection Time: 11/07/20  5:05 PM  Result Value Ref Range   Sodium 137 135 - 145 mmol/L   Potassium 3.4 (L) 3.5 - 5.1 mmol/L   Chloride 104 98 - 111 mmol/L   CO2 26 22 - 32 mmol/L   Glucose, Bld 107 (H) 70 - 99 mg/dL   BUN 18 6 - 20 mg/dL   Creatinine, Ser 0.01 (H) 0.61 - 1.24 mg/dL   Calcium 8.5 (L) 8.9 - 10.3 mg/dL   GFR, Estimated >74 >94 mL/min   Anion gap 7 5 - 15  Resp Panel by RT-PCR (Flu A&B, Covid) Nasopharyngeal Swab     Status: None   Collection Time: 11/07/20  5:05 PM   Specimen: Nasopharyngeal Swab; Nasopharyngeal(NP) swabs in vial transport medium  Result Value Ref Range   SARS Coronavirus 2 by RT PCR NEGATIVE NEGATIVE   Influenza A by PCR NEGATIVE NEGATIVE   Influenza B by PCR NEGATIVE NEGATIVE    BMET Recent Labs    11/05/20 2114 11/07/20 1705  NA 138 137  K 3.3* 3.4*  CL 105 104  CO2 24 26  GLUCOSE 116* 107*  BUN 17 18  CREATININE 1.09 1.27*  CALCIUM 9.1 8.5*   PT/INR No results for input(s): LABPROT, INR in the last 72 hours. ABG No results for input(s): PHART, HCO3 in the last 72 hours.  Invalid input(s): PCO2, PO2  Studies/Results: DG Abdomen 1 View  Result Date: 11/07/2020 CLINICAL DATA:  Worsening left flank pain with nausea and vomiting. EXAM: ABDOMEN - 1 VIEW COMPARISON:  CT November 05, 2020. FINDINGS: The bowel gas pattern is normal. No radio-opaque calculi. Ventral hernia repair tacks. IMPRESSION: No radiopaque renal calculus visualized. Nonobstructive bowel gas pattern. Electronically Signed   By: Maudry Mayhew M.D.   On: 11/07/2020 16:41   DG OR UROLOGY CYSTO IMAGE ([facility] ONLY)  Result Date: 11/07/2020 There is no interpretation for this exam.  This order is for images  obtained during a surgical procedure.  Please See "Surgeries" Tab for more information regarding the procedure.   CT Renal Stone Study  Result Date: 11/05/2020 CLINICAL DATA:  Left flank pain. EXAM: CT ABDOMEN AND PELVIS WITHOUT CONTRAST TECHNIQUE: Multidetector CT imaging of the abdomen and pelvis was performed following the standard protocol without IV contrast. COMPARISON:  August 21, 2019 FINDINGS: Lower chest: No acute abnormality. Hepatobiliary: No focal liver abnormality is seen. No gallstones, gallbladder wall thickening, or biliary dilatation. Pancreas: Unremarkable. No pancreatic ductal dilatation or surrounding inflammatory changes. Spleen: Normal in size without focal abnormality. Adrenals/Urinary Tract: Adrenal glands are unremarkable. Kidneys are normal in size. A 4 mm obstructing renal stone is seen within the proximal left ureter, with mild left-sided hydronephrosis and hydroureter. Small, predominantly stable bilateral hemorrhagic and nonhemorrhagic renal cysts are seen. Bladder is unremarkable. Stomach/Bowel: Stomach is within normal limits. Appendix appears normal. No evidence of bowel wall thickening, distention, or inflammatory changes. Vascular/Lymphatic: No significant vascular findings are present. No enlarged abdominal or pelvic lymph nodes. Reproductive: Prostate is unremarkable. Other: Multiple surgical coils are seen within the anterior aspect of the mid abdomen, along the midline. No abdominopelvic ascites. Musculoskeletal: No acute or significant osseous findings. IMPRESSION: 1. 4 mm obstructing renal stone within the proximal left ureter. 2. Bilateral hemorrhagic and  nonhemorrhagic renal cysts. Electronically Signed   By: Aram Candela M.D.   On: 11/05/2020 22:24    I have discussed the patient with Dr. Orpah Clinton and reviewed the pertinent imaging, labs and notes.  Assessment/Plan: 108mm left ureteral stone with intractable pain.  I will take him emergently to the OR for cystoscopy  with left retrograde pyelography, possible left ureteroscopy with laser and left ureteral stent insertion.  I have reviewed the risks of bleeding, infection, injury to the urinary structures, need for a secondary procedure, thrombotic events and anesthetic complications.    AKI.  His Cr is up to 1.27 from 1.09 over the last 24 hours.   Bilateral small hemorrhagic and non-hemorrhagic cysts.   These are stable since 6/21 and appeared simple on a Lumbar MRI from 6/21 with the kidneys being imaged well.  No further imaging is needed.     No follow-ups on file.    CC: Dr. Eric Form.      Bjorn Pippin 11/07/2020 [phone]

## 2020-11-07 NOTE — Anesthesia Preprocedure Evaluation (Signed)
Anesthesia Evaluation  Patient identified by MRN, date of birth, ID band Patient awake  General Assessment Comment:Patient actively retching  Reviewed: Allergy & Precautions, NPO status , Patient's Chart, lab work & pertinent test results  History of Anesthesia Complications (+) PONV and history of anesthetic complications  Airway Mallampati: III  TM Distance: >3 FB Neck ROM: Full    Dental no notable dental hx. (+) Teeth Intact   Pulmonary neg pulmonary ROS, neg sleep apnea, neg COPD, Patient abstained from smoking.Not current smoker,    Pulmonary exam normal breath sounds clear to auscultation       Cardiovascular Exercise Tolerance: Good METS(-) hypertension(-) CAD and (-) Past MI negative cardio ROS  (-) dysrhythmias  Rhythm:Regular Rate:Normal - Systolic murmurs    Neuro/Psych negative neurological ROS  negative psych ROS   GI/Hepatic neg GERD  ,(+)     (-) substance abuse  ,   Endo/Other  neg diabetes  Renal/GU negative Renal ROS     Musculoskeletal   Abdominal   Peds  Hematology   Anesthesia Other Findings Past Medical History: No date: Kidney stone  Reproductive/Obstetrics                             Anesthesia Physical Anesthesia Plan  ASA: 2 and emergent  Anesthesia Plan: General   Post-op Pain Management:    Induction: Intravenous and Rapid sequence  PONV Risk Score and Plan: 3 and Ondansetron, Dexamethasone, Propofol infusion, Midazolam, TIVA and Treatment may vary due to age or medical condition  Airway Management Planned: Oral ETT  Additional Equipment: None  Intra-op Plan:   Post-operative Plan: Extubation in OR  Informed Consent: I have reviewed the patients History and Physical, chart, labs and discussed the procedure including the risks, benefits and alternatives for the proposed anesthesia with the patient or authorized representative who has indicated  his/her understanding and acceptance.     Dental advisory given  Plan Discussed with: CRNA and Surgeon  Anesthesia Plan Comments: (Discussed risks of anesthesia with patient, including PONV, sore throat, lip/dental damage. Rare risks discussed as well, such as cardiorespiratory and neurological sequelae, aspiration, and allergic reactions. Patient understands.)        Anesthesia Quick Evaluation

## 2020-11-07 NOTE — ED Notes (Signed)
Pt family updated on POC.

## 2020-11-07 NOTE — ED Triage Notes (Signed)
Pt reports that he was here yesterday and diagnosised with Kidney stones. Medication is not helping him. Pt pale and clammy. Actively vomiting in triage

## 2020-11-07 NOTE — ED Notes (Signed)
Report given to OR RN.

## 2020-11-07 NOTE — ED Notes (Signed)
Informed RN bed assigned 

## 2020-11-07 NOTE — Transfer of Care (Signed)
Immediate Anesthesia Transfer of Care Note  Patient: Curtis Brown.  Procedure(s) Performed: CYSTOSCOPY WITH RETROGRADE PYELOGRAM/URETERAL STENT PLACEMENT POSSIBLE LASER (Left)  Patient Location: PACU  Anesthesia Type:General  Level of Consciousness: drowsy  Airway & Oxygen Therapy: Patient Spontanous Breathing  Post-op Assessment: Report given to RN  Post vital signs: stable  Last Vitals:  Vitals Value Taken Time  BP 131/89 11/07/20 2101  Temp 37.9 C 11/07/20 2100  Pulse 81 11/07/20 2104  Resp 13 11/07/20 2104  SpO2 97 % 11/07/20 2104  Vitals shown include unvalidated device data.  Last Pain:  Vitals:   11/07/20 2100  TempSrc:   PainSc: Asleep         Complications: No notable events documented.

## 2020-11-07 NOTE — ED Notes (Signed)
Pt placed on cardiac monitor 

## 2020-11-07 NOTE — ED Provider Notes (Signed)
[facility] Emergency Department Provider Note ____________________________________________   Event Date/Time   First MD Initiated Contact with Patient 11/07/20 1355     (approximate)  I have reviewed the triage vital signs and the nursing notes.  HISTORY  Chief Complaint Nephrolithiasis   HPI Curtis Brown. is a 45 y.o. malewho presents to the ED for evaluation of flank pain and possible kidney stone.  Chart review indicates patient was just seen overnight 2 nights ago by one of my colleagues for left-sided flank pain, found to have a proximal 4 mm ureteral stone.  Discharged yesterday morning with urology follow-up after pain control.  Patient reports his pain was "manageable" when he left this morning.  He reports waking up with slightly increased sharp pain to his left flank of similar quality, so he went to pick up his prescribed analgesia and took these medications.  He reports slight improvement after this, but about 30 minutes prior to arrival he developed severely worsening 10/10 left-sided flank pain with associated nausea and emesis.  He reports the same quality of pain, sharp and stabbing, and typical with his history of ureterolithiasis.[name]  He reports his hematuria has resolved when voiding this morning, and denies any dysuria, fevers, syncope or additional symptoms.  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.  Past Medical History:  Diagnosis Date   Kidney stone     Patient Active Problem List   Diagnosis Date Noted   Unstable angina (HCC) 11/01/2017    Past Surgical History:  Procedure Laterality Date   HERNIA REPAIR     INGUINAL HERNIA REPAIR     LEG SURGERY      Prior to Admission medications   Medication Sig Start Date End Date Taking? Authorizing Provider  docusate sodium (COLACE) 100 MG capsule Take 1 tablet once or twice daily as needed for constipation while taking narcotic pain medicine 11/06/20   Loleta Rose, MD   HYDROcodone-acetaminophen (NORCO/VICODIN) 5-325 MG tablet Take 2 tablets by mouth every 6 (six) hours as needed for moderate pain or severe pain. 11/06/20   Loleta Rose, MD  losartan (COZAAR) 25 MG tablet Take 25 mg by mouth daily.    [provider]  methylPREDNISolone (MEDROL DOSEPAK) 4 MG TBPK tablet Take as directed on the packaging 08/21/19   Miguel Aschoff., MD  ondansetron (ZOFRAN ODT) 4 MG disintegrating tablet Allow 1-2 tablets to dissolve in your mouth every 8 hours as needed for nausea/vomiting 11/06/20   Loleta Rose, MD  promethazine (PHENERGAN) 25 MG tablet Take 1 tablet (25 mg total) by mouth every 6 (six) hours as needed for nausea or vomiting. 06/08/20   Ward, Layla Maw, DO  tamsulosin (FLOMAX) 0.4 MG CAPS capsule Take 1 tablet by mouth daily until you pass the kidney stone or no longer have symptoms 11/06/20   Loleta Rose, MD    Allergies Oxycodone-acetaminophen and Percocet [oxycodone-acetaminophen]  No family history on file.  Social History Social History   Tobacco Use   Smoking status: Never   Smokeless tobacco: Never  Substance Use Topics   Alcohol use: Yes    Comment: occassionally   Drug use: No    Review of Systems  Constitutional: No fever/chills Eyes: No visual changes. ENT: No sore throat. Cardiovascular: Denies chest pain. Respiratory: Denies shortness of breath. Gastrointestinal: Positive for left-sided flank pain, nausea and vomiting.  No diarrhea.  No constipation. Genitourinary: Negative for dysuria. Musculoskeletal: Negative for back pain. Skin:  Negative for rash. Neurological: Negative for headaches, focal weakness or numbness. ____________________________________________  PHYSICAL EXAM:  VITAL SIGNS: Vitals:   11/07/20 1356 11/07/20 1430  BP: (!) 150/97 (!) 109/98  Pulse: 69 88  Resp: 20 18  Temp: 98 F (36.7 C)   SpO2: 100% 98%    Constitutional: Alert and oriented.  Sitting up in bed and appears quite uncomfortable,  clutching his left side.  Intermittent heaving into emesis bag. Eyes: Conjunctivae are normal. PERRL. EOMI. Head: Atraumatic. Nose: No congestion/rhinnorhea. Mouth/Throat: Mucous membranes are moist.  Oropharynx non-erythematous. Neck: No stridor. No cervical spine tenderness to palpation. Cardiovascular: Normal rate, regular rhythm. Grossly normal heart sounds.  Good peripheral circulation. Respiratory: Normal respiratory effort.  No retractions. Lungs CTAB. Gastrointestinal: Soft , nondistended.  Left-sided CVA tenderness is present.  Benign frontal abdomen. Musculoskeletal: No lower extremity tenderness nor edema.  No joint effusions. No signs of acute trauma. Neurologic:  Normal speech and language. No gross focal neurologic deficits are appreciated. No gait instability noted. Skin:  Skin is warm, dry and intact. No rash noted. Psychiatric: Mood and affect are normal. Speech and behavior are normal. ____________________________________________   LABS (all labs ordered are listed, but only abnormal results are displayed)  Labs Reviewed  URINALYSIS, COMPLETE (UACMP) WITH MICROSCOPIC   ____________________________________________  12 Lead EKG   ____________________________________________  RADIOLOGY  ED MD interpretation:  CT renal study from about 36 hours ago reviewed by me with 4 mm proximal left ureteral stone  Official radiology report(s): No results found.  ____________________________________________   PROCEDURES and INTERVENTIONS  Procedure(s) performed (including Critical Care):  .1-3 Lead EKG Interpretation  Date/Time: 11/07/2020 3:17 PM Performed by: Delton Prairie, MD Authorized by: Delton Prairie, MD     Interpretation: normal     ECG rate:  80   ECG rate assessment: normal     Rhythm: sinus rhythm     Ectopy: none     Conduction: normal    Medications  lidocaine (XYLOCAINE) 136 mg in sodium chloride 0.9 % 100 mL IVPB (has no administration in time  range)  ketorolac (TORADOL) 30 MG/ML injection 15 mg (has no administration in time range)  ondansetron (ZOFRAN) injection 4 mg (4 mg Intravenous Given 11/07/20 1411)  lactated ringers bolus 1,000 mL (1,000 mLs Intravenous New Bag/Given 11/07/20 1411)  HYDROmorphone (DILAUDID) injection 1 mg (1 mg Intravenous Given 11/07/20 1411)    ____________________________________________   MDM / ED COURSE   45 year old male with history of nephrolithiasis presents to the ED with left-sided ureteral colic.  He was just here about 36 hours ago and had extensive work-up including CT renal study with 4 mm proximal left ureteral stone with mild obstructive pathology.  He returns today with recurrence of ureteral colic.  Low suspicion for UTI or infection, ACS or other cardiopulmonary pathology.  We will pursue analgesia and control of his pain.  Pain is well controlled or additional features, may require further diagnostics, but I see no indications at this time.  Patient signed out to oncoming provider  Clinical Course as of 11/07/20 1516  Sat Nov 07, 2020  1409 Discuss plan of care with the patient, he's in agreement. [DS]  1432 Reassessed.  Minimal improvement with Dilaudid.  We will try some IV lidocaine. [DS]    Clinical Course User Index [DS] Delton Prairie, MD    ____________________________________________   FINAL CLINICAL IMPRESSION(S) / ED DIAGNOSES  Final diagnoses:  Ureteral colic  Left flank pain     ED  Discharge Orders     None        Shondrea Steinert Katrinka Blazing   Note:  This document was prepared using Conservation officer, historic buildings and may include unintentional dictation errors.    Delton Prairie, MD 11/07/20 [phone]

## 2020-11-07 NOTE — ED Provider Notes (Signed)
Assumed care from Dr. Katrinka Blazing at 3 PM. Briefly, the patient is a 45 y.o. male with PMHx of  has a past medical history of Kidney stone. here with flank pain. Recently diagnosed 4 mm stone. Pt is afebrile, HDS, and nontoxic. UA pending. Plan for pain control, f/u ua, dispo if able. No signs of infection clinically.   Labs Reviewed  URINALYSIS, COMPLETE (UACMP) WITH MICROSCOPIC    Course of Care: -Pain is persistent and severe despite lido, toradol, fentanyl, dilaudid. He continues to vomit.  Discussed with Dr. Annabell Howells.  Given intractable pain, will plan to go to the OR for likely stent placement.  Patient would prefer this versus admission for pain control.  Patient is NPO.  COVID-negative.  UA without signs of UTI.     Shaune Pollack, MD 11/07/20 Norberta Keens

## 2020-11-07 NOTE — Anesthesia Procedure Notes (Signed)
Procedure Name: Intubation Date/Time: 11/07/2020 8:30 PM Performed by: Garner Nash, CRNA Pre-anesthesia Checklist: Patient identified, Emergency Drugs available, Suction available and Patient being monitored Patient Re-evaluated:Patient Re-evaluated prior to induction Oxygen Delivery Method: Circle system utilized Preoxygenation: Pre-oxygenation with 100% oxygen Induction Type: IV induction Ventilation: Mask ventilation without difficulty Laryngoscope Size: Mac and 3 Grade View: Grade III Tube type: Oral Tube size: 7.5 mm Number of attempts: 1 Airway Equipment and Method: Oral airway Placement Confirmation: ETT inserted through vocal cords under direct vision, positive ETCO2 and breath sounds checked- equal and bilateral Tube secured with: Tape Dental Injury: Teeth and Oropharynx as per pre-operative assessment

## 2020-11-08 LAB — HIV ANTIBODY (ROUTINE TESTING W REFLEX): HIV Screen 4th Generation wRfx: NONREACTIVE

## 2020-11-08 NOTE — Discharge Summary (Signed)
Physician Discharge Summary  Patient ID: Curtis Brown. MRN: [id number] DOB/AGE: April 30, 1975 45 y.o.  Admit date: 11/07/2020 Discharge date: 11/08/2020  Admission Diagnoses:  Discharge Diagnoses:  Active Problems:   Left ureteral stone   Discharged Condition: good  Hospital Course: Pt admitted following cystoscopy and left ureteral stent and left proximal ureteral stone. The stone could not be reached with the ureteroscope. He is well this morning with pain resolved and tolerating a regular diet.   Consults: None  Significant Diagnostic Studies: KUB  Treatments: surgery: cystoscopy, left ureteroscopy, left ureteral stent placement   Discharge Exam: Blood pressure (!) 132/99, pulse 71, temperature 97.7 F (36.5 C), temperature source Oral, resp. rate 18, height 5\' 10"  (1.778 m), weight 90.7 kg, SpO2 98 %. I spoke with nurse and and he reports "all his pain is gone and he feels great". He is tolerating a regular diet. Discussed with Ladislav stone NOT removed and importance of f/u to remove stone/stent.   Disposition: Discharge disposition: 01-Home or Self Care       Discharge Instructions     Discharge patient   Complete by: As directed    Discharge disposition: 01-Home or Self Care   Discharge patient date: 11/08/2020      Medication Instructions  CONTINUE taking these medications  CONTINUE taking these medications   Morning Noon Evening Bedtime As Needed  acetaminophen 325 MG tablet Commonly known as: TYLENOL Take 650 mg by mouth every 6 (six) hours as needed.         docusate sodium 100 MG capsule Commonly known as: Colace Take 1 tablet once or twice daily as needed for constipation while taking narcotic pain medicine         HYDROcodone-acetaminophen 5-325 MG tablet Commonly known as: NORCO/VICODIN Take 2 tablets by mouth every 6 (six) hours as needed for moderate pain or severe pain.         tamsulosin 0.4 MG Caps capsule Commonly known as: FLOMAX Take  1 tablet by mouth daily until you pass the kidney stone or no longer have symptoms                     Follow-up Information     [facility] Follow up.   Why: The office will call to arrange your next procedure for 1-2 weeks.[facility]. Call.   Specialty: Urology Contact information: [address] [place], [address] [place] [phone]                Signed: [phone] 11/08/2020, 9:54 AM

## 2020-11-08 NOTE — Discharge Instructions (Addendum)
Ureteral Stent Implantation, Care After This sheet gives you information about how to care for yourself after your procedure. Your health care provider may also give you more specific instructions. If you have problems or questions, contact your health care provider.  STENT/STONE REMOVAL - you have a left ureteral stent and we were NOT able to reach the stone to remove it. Be sure to call and follow-up with [facility] to plan STENT and STONE removal.   What can I expect after the procedure? After the procedure, it is common to have: Nausea. Mild pain when you urinate. You may feel this pain in your lower back or lower abdomen. The pain should stop within a few minutes after you urinate. This may last for up to 1 week. A small amount of blood in your urine for several days. Follow these instructions at home: Medicines Take over-the-counter and prescription medicines only as told by your health care provider. If you were prescribed an antibiotic medicine, take it as told by your health care provider. Do not stop taking the antibiotic even if you start to feel better. Do not drive for 24 hours if you were given a sedative during your procedure. Ask your health care provider if the medicine prescribed to you requires you to avoid driving or using heavy machinery. Activity Rest as told by your health care provider. Avoid sitting for a long time without moving. Get up to take short walks every 1-2 hours. This is important to improve blood flow and breathing. Ask for help if you feel weak or unsteady. Return to your normal activities as told by your health care provider. Ask your health care provider what activities are safe for you. General instructions Watch for any blood in your urine. Call your health care provider if the amount of blood in your urine increases. If you have a catheter: Follow instructions from your health care provider about taking care of your catheter and collection  bag. Do not take baths, swim, or use a hot tub until your health care provider approves. Ask your health care provider if you may take showers. You may only be allowed to take sponge baths. Drink enough fluid to keep your urine pale yellow. Do not use any products that contain nicotine or tobacco, such as cigarettes, e-cigarettes, and chewing tobacco. These can delay healing after surgery. If you need help quitting, ask your health care provider. Keep all follow-up visits as told by your health care provider. This is important. Contact a health care provider if: You have pain that gets worse or does not get better with medicine, especially pain when you urinate. You have difficulty urinating. You feel nauseous or you vomit repeatedly during a period of more than 2 days after the procedure. Get help right away if: Your urine is dark red or has blood clots in it. You are leaking urine (have incontinence). The end of the stent comes out of your urethra. You cannot urinate. You have sudden, sharp, or severe pain in your abdomen or lower back. You have a fever. You have swelling or pain in your legs. You have difficulty breathing. Summary After the procedure, it is common to have mild pain when you urinate that goes away within a few minutes after you urinate. This may last for up to 1 week. Watch for any blood in your urine. Call your health care provider if the amount of blood in your urine increases. Take over-the-counter and prescription medicines only as told  by your health care provider. Drink enough fluid to keep your urine pale yellow. This information is not intended to replace advice given to you by your health care provider. Make sure you discuss any questions you have with your health care provider. Document Revised: 11/28/2017 Document Reviewed: 11/29/2017

## 2020-11-08 NOTE — Progress Notes (Signed)
Discharge teaching complete. Meds, diet, activity, follow up appointments reviewed and all questions answered. Copy of instructions given to patient. Patient discharged home with family.

## 2020-11-08 NOTE — Progress Notes (Addendum)
  November 08, 2020  Patient: Quadir Muns.  Date of Birth: February 17, 1976  Date of Visit: 11/07/2020    To Whom It May Concern:  Elston Aldape was seen and treated in our hospital on 11/07/2020. Ilean Skill.  may return to work on 11/10/2020 .  Sincerely,   Doyne Keel, MD

## 2020-11-09 ENCOUNTER — Encounter: Payer: Self-pay | Admitting: Emergency Medicine

## 2020-11-09 ENCOUNTER — Other Ambulatory Visit: Payer: Self-pay

## 2020-11-09 ENCOUNTER — Emergency Department
Admission: EM | Admit: 2020-11-09 | Discharge: 2020-11-09 | Disposition: A | Discharge status: Home / Self Care | Payer: PRIVATE HEALTH INSURANCE | Attending: Emergency Medicine | Admitting: Emergency Medicine

## 2020-11-09 DIAGNOSIS — R109 Unspecified abdominal pain: Secondary | ICD-10-CM | POA: Insufficient documentation

## 2020-11-09 DIAGNOSIS — R350 Frequency of micturition: Secondary | ICD-10-CM | POA: Insufficient documentation

## 2020-11-09 DIAGNOSIS — R309 Painful micturition, unspecified: Secondary | ICD-10-CM | POA: Insufficient documentation

## 2020-11-09 LAB — CBC
HCT: 42 % (ref 39.0–52.0)
Hemoglobin: 14.6 g/dL (ref 13.0–17.0)
MCH: 29.7 pg (ref 26.0–34.0)
MCHC: 34.8 g/dL (ref 30.0–36.0)
MCV: 85.4 fL (ref 80.0–100.0)
Platelets: 191 10*3/uL (ref 150–400)
RBC: 4.92 MIL/uL (ref 4.22–5.81)
RDW: 12.7 % (ref 11.5–15.5)
WBC: 7.2 10*3/uL (ref 4.0–10.5)
nRBC: 0 % (ref 0.0–0.2)

## 2020-11-09 LAB — URINALYSIS, COMPLETE (UACMP) WITH MICROSCOPIC
Bacteria, UA: NONE SEEN
RBC / HPF: >50 RBC/hpf — ABNORMAL HIGH (ref 0–5)
Specific Gravity, Urine: 1.025 (ref 1.005–1.030)
Squamous Epithelial / HPF: NONE SEEN (ref 0–5)
WBC, UA: >50 WBC/hpf — ABNORMAL HIGH (ref 0–5)

## 2020-11-09 LAB — BASIC METABOLIC PANEL
Anion gap: 5 (ref 5–15)
BUN: 22 mg/dL — ABNORMAL HIGH (ref 6–20)
CO2: 29 mmol/L (ref 22–32)
Calcium: 8.6 mg/dL — ABNORMAL LOW (ref 8.9–10.3)
Chloride: 107 mmol/L (ref 98–111)
Creatinine, Ser: 1.18 mg/dL (ref 0.61–1.24)
GFR, Estimated: >60 mL/min (ref >60)
Glucose, Bld: 106 mg/dL — ABNORMAL HIGH (ref 70–99)
Potassium: 3.7 mmol/L (ref 3.5–5.1)
Sodium: 141 mmol/L (ref 135–145)

## 2020-11-09 MED ORDER — SODIUM CHLORIDE 0.9 % IV BOLUS
1000.0000 mL | Freq: Once | INTRAVENOUS | Status: AC
  Administered 2020-11-09: 1000 mL via INTRAVENOUS

## 2020-11-09 MED ORDER — KETOROLAC TROMETHAMINE 30 MG/ML IJ SOLN
15.0000 mg | Freq: Once | INTRAMUSCULAR | Status: AC
  Administered 2020-11-09: 15 mg via INTRAVENOUS
  Filled 2020-11-09: qty 1

## 2020-11-09 MED ORDER — MORPHINE SULFATE (PF) 4 MG/ML IV SOLN
4.0000 mg | Freq: Once | INTRAVENOUS | Status: AC
  Administered 2020-11-09: 4 mg via INTRAVENOUS
  Filled 2020-11-09: qty 1

## 2020-11-09 NOTE — ED Provider Notes (Signed)
[facility]  ____________________________________________   Event Date/Time   First MD Initiated Contact with Patient 11/09/20 [phone]     (approximate)  I have reviewed the triage vital signs and the nursing notes.   HISTORY  Chief Complaint Flank Pain    HPI Curtis Brown. is a 45 y.o. male past medical history of kidney stone, unstable angina who presents with left flank pain.  Patient was seen in the ED on 9-1 and had a CT that showed a 4 mm stone.  He returned on 9 3 for intractable pain and had a stent placed by Dr. Alycia Rossetti.  He was discharged yesterday.  Initially pain and improved then this morning he woke up with flank pain.  Pain is on the left side is similar to prior.  He denies any associated nausea or vomiting.  Does have burning with urination and some frequency.  Denies fevers or chills.  He has been taking hydrocodone for the pain which is not improving it.         Past Medical History:  Diagnosis Date   Kidney stone     Patient Active Problem List   Diagnosis Date Noted   Left ureteral stone 11/07/2020   Unstable angina (HCC) 11/01/2017    Past Surgical History:  Procedure Laterality Date   HERNIA REPAIR     INGUINAL HERNIA REPAIR     LEG SURGERY      Prior to Admission medications   Medication Sig Start Date End Date Taking? Authorizing Provider  acetaminophen (TYLENOL) 325 MG tablet Take 650 mg by mouth every 6 (six) hours as needed.    [provider]  docusate sodium (COLACE) 100 MG capsule Take 1 tablet once or twice daily as needed for constipation while taking narcotic pain medicine 11/06/20   Loleta Rose, MD  HYDROcodone-acetaminophen (NORCO/VICODIN) 5-325 MG tablet Take 2 tablets by mouth every 6 (six) hours as needed for moderate pain or severe pain. 11/06/20   Loleta Rose, MD  tamsulosin (FLOMAX) 0.4 MG CAPS capsule Take 1 tablet by mouth daily until you pass the kidney stone or no longer have symptoms 11/06/20    Loleta Rose, MD    Allergies Oxycodone-acetaminophen and Percocet [oxycodone-acetaminophen]  No family history on file.  Social History Social History   Tobacco Use   Smoking status: Never   Smokeless tobacco: Never  Substance Use Topics   Alcohol use: Yes    Comment: occassionally   Drug use: No    Review of Systems   Review of Systems  Constitutional:  Negative for appetite change, chills and fever.  Gastrointestinal:  Negative for abdominal distention, abdominal pain, nausea and vomiting.  Genitourinary:  Positive for dysuria, flank pain and frequency.  All other systems reviewed and are negative.  Physical Exam Updated Vital Signs BP 119/83   Pulse 68   Temp 98.3 F (36.8 C) (Oral)   Resp 16   Ht 5\' 10"  (1.778 m)   Wt 90 kg   SpO2 94%   BMI 28.47 kg/m   Physical Exam Vitals and nursing note reviewed.  Constitutional:      General: He is in acute distress.     Appearance: Normal appearance.  HENT:     Head: Normocephalic and atraumatic.  Eyes:     General: No scleral icterus.    Conjunctiva/sclera: Conjunctivae normal.  Pulmonary:     Effort: Pulmonary effort is normal. No respiratory distress.     Breath sounds:  Normal breath sounds. No wheezing.  Abdominal:     General: Abdomen is flat. There is no distension.     Tenderness: There is no abdominal tenderness. There is left CVA tenderness.  Musculoskeletal:        General: No deformity or signs of injury.     Cervical back: Normal range of motion.  Skin:    Coloration: Skin is not jaundiced or pale.  Neurological:     General: No focal deficit present.     Mental Status: He is alert and oriented to person, place, and time. Mental status is at baseline.  Psychiatric:        Mood and Affect: Mood normal.        Behavior: Behavior normal.     LABS (all labs ordered are listed, but only abnormal results are displayed)  Labs Reviewed  URINALYSIS, COMPLETE (UACMP) WITH MICROSCOPIC - Abnormal;  Notable for the following components:      Result Value   Color, Urine RED (*)    APPearance CLOUDY (*)    Glucose, UA   (*)    Value: TEST NOT REPORTED DUE TO COLOR INTERFERENCE OF URINE PIGMENT   Hgb urine dipstick   (*)    Value: TEST NOT REPORTED DUE TO COLOR INTERFERENCE OF URINE PIGMENT   Bilirubin Urine   (*)    Value: TEST NOT REPORTED DUE TO COLOR INTERFERENCE OF URINE PIGMENT   Ketones, ur   (*)    Value: TEST NOT REPORTED DUE TO COLOR INTERFERENCE OF URINE PIGMENT   Protein, ur   (*)    Value: TEST NOT REPORTED DUE TO COLOR INTERFERENCE OF URINE PIGMENT   Nitrite   (*)    Value: TEST NOT REPORTED DUE TO COLOR INTERFERENCE OF URINE PIGMENT   Leukocytes,Ua   (*)    Value: TEST NOT REPORTED DUE TO COLOR INTERFERENCE OF URINE PIGMENT   RBC / HPF >50 (*)    WBC, UA >50 (*)    All other components within normal limits  BASIC METABOLIC PANEL - Abnormal; Notable for the following components:   Glucose, Bld 106 (*)    BUN 22 (*)    Calcium 8.6 (*)    All other components within normal limits  CBC   ____________________________________________  EKG  N/a ____________________________________________  RADIOLOGY Ky Barban, personally viewed and evaluated these images (plain radiographs) as part of my medical decision making, as well as reviewing the written report by the radiologist.  ED MD interpretation:  n/a    ____________________________________________   PROCEDURES  Procedure(s) performed (including Critical Care):  Procedures   ____________________________________________   INITIAL IMPRESSION / ASSESSMENT AND PLAN / ED COURSE     45 year old male presenting with recurrent left flank pain after recent stent with urology.  Vital signs are within normal limits.  He is however in moderate distress and has left CVA tenderness.  Hemoglobin is at baseline no leukocytosis and his creatinine is actually improved.  Patient given morphine and Toradol as  well as fluids.  UA shows greater than 50 RBCs as well as greater than 50 white blood cells.  I spoke with Dr. Mena Goes on-call for urology who discharged the patient yesterday who notes that this is most likely stent pain and this is to be expected but should calm down within the next 2 days.  We discussed the white blood cells on the urinalysis and he notes that if the patient is afebrile without systemic signs  which he is he would not give antibiotics.  He advised alternating NSAIDs with opioids.   On reassessment after Toradol morphine patient feeling significantly improved.  Will discharge home.  Discussed return precautions for inability to tolerate p.o. and fever.     ____________________________________________   FINAL CLINICAL IMPRESSION(S) / ED DIAGNOSES  Final diagnoses:  None     ED Discharge Orders     None        Note:  This document was prepared using Dragon voice recognition software and may include unintentional dictation errors.    Georga Hacking, MD 11/09/20 1058

## 2020-11-09 NOTE — Discharge Instructions (Addendum)
Please alternate ibuprofen with hydrocodone.  Return to the emergency department if you develop fever or are unable to tolerate food or liquids.

## 2020-11-09 NOTE — ED Triage Notes (Signed)
Patient to ER for c/o "kidney stone" with pain to left flank. Was seen on 9/3 and diagnosed with kidney stone. Had procedure done when in ER that day for stone. States this am pain was worse again.

## 2020-11-10 ENCOUNTER — Telehealth: Payer: Self-pay | Admitting: Urology

## 2020-11-10 ENCOUNTER — Encounter: Payer: Self-pay | Admitting: Urology

## 2020-11-10 NOTE — Telephone Encounter (Signed)
Appt made Patient is aware  Curtis Brown

## 2020-11-10 NOTE — Anesthesia Postprocedure Evaluation (Signed)
Anesthesia Post Note  Patient: Curtis Brown.  Procedure(s) Performed: CYSTOSCOPY WITH RETROGRADE PYELOGRAM/URETERAL STENT PLACEMENT POSSIBLE LASER (Left)  Patient location during evaluation: PACU Anesthesia Type: General Level of consciousness: awake and alert Pain management: pain level controlled Vital Signs Assessment: post-procedure vital signs reviewed and stable Respiratory status: spontaneous breathing, nonlabored ventilation, respiratory function stable and patient connected to nasal cannula oxygen Cardiovascular status: blood pressure returned to baseline and stable Postop Assessment: no apparent nausea or vomiting Anesthetic complications: no   No notable events documented.   Last Vitals:  Vitals:   11/08/20 0507 11/08/20 0755  BP: 126/90 (!) 132/99  Pulse: 72 71  Resp: 18 18  Temp: 36.7 C 36.5 C  SpO2: 96% 98%    Last Pain:  Vitals:   11/08/20 0755  TempSrc: Oral  PainSc:                  Corinda Gubler

## 2020-11-11 ENCOUNTER — Other Ambulatory Visit: Payer: Self-pay | Admitting: Urology

## 2020-11-11 ENCOUNTER — Ambulatory Visit: Payer: No Typology Code available for payment source | Admitting: Urology

## 2020-11-11 ENCOUNTER — Other Ambulatory Visit: Payer: Self-pay

## 2020-11-11 ENCOUNTER — Encounter: Payer: Self-pay | Admitting: Urology

## 2020-11-11 VITALS — BP 156/100 | HR 80 | Ht 70.0 in | Wt 198.0 lb

## 2020-11-11 DIAGNOSIS — N200 Calculus of kidney: Secondary | ICD-10-CM

## 2020-11-11 DIAGNOSIS — N201 Calculus of ureter: Secondary | ICD-10-CM

## 2020-11-11 MED ORDER — OXYBUTYNIN CHLORIDE ER 10 MG PO TB24: 10.0000 mg | ORAL_TABLET | Freq: Every day | ORAL | 0 refills | Status: DC

## 2020-11-11 NOTE — Patient Instructions (Signed)
Laser Therapy for Kidney Stones Laser therapy for kidney stones is a procedure to break up small, hard mineral deposits that form in the kidney (kidney stones). The procedure is done using a device that produces a focused beam of light (laser). The laser breaks up kidney stones into pieces that are small enough to be passed out of the body through urination or removed from the body during the procedure. You may need laser therapy if you have kidney stones that arepainful or block your urinary tract. This procedure is done by inserting a tube (ureteroscope) into your kidney through the urethral opening. The urethra is the part of the body that drains urine from the bladder. In women, the urethra opens above the vaginal opening. In men, the urethra opens at the tip of the penis. The ureteroscope is inserted through the urethra, and surgical instruments are moved through the bladder and the muscular tube that connects the kidney to the bladder (ureter) until they reach the kidney. Tell a health care provider about: Any allergies you have. All medicines you are taking, including vitamins, herbs, eye drops, creams, and over-the-counter medicines. Any problems you or family members have had with anesthetic medicines. Any blood disorders you have. Any surgeries you have had. Any medical conditions you have. Whether you are pregnant or may be pregnant. What are the risks? Generally, this is a safe procedure. However, problems may occur, including: Infection. Bleeding. Allergic reactions to medicines. Damage to the urethra, bladder, or ureter. Urinary tract infection (UTI). Narrowing of the urethra (urethral stricture). Difficulty passing urine. Blockage of the kidney caused by a fragment of kidney stone. What happens before the procedure? Medicines Ask your health care provider about: Changing or stopping your regular medicines. This is especially important if you are taking diabetes medicines or  blood thinners. Taking medicines such as aspirin and ibuprofen. These medicines can thin your blood. Do not take these medicines unless your health care provider tells you to take them. Taking over-the-counter medicines, vitamins, herbs, and supplements. Eating and drinking Follow instructions from your health care provider about eating and drinking, which may include: 8 hours before the procedure - stop eating heavy meals or foods, such as meat, fried foods, or fatty foods. 6 hours before the procedure - stop eating light meals or foods, such as toast or cereal. 6 hours before the procedure - stop drinking milk or drinks that contain milk. 2 hours before the procedure - stop drinking clear liquids. Staying hydrated Follow instructions from your health care provider about hydration, which may include: Up to 2 hours before the procedure - you may continue to drink clear liquids, such as water, clear fruit juice, black coffee, and plain tea.  General instructions You may have a physical exam before the procedure. You may also have tests, such as imaging tests and blood or urine tests. If your ureter is too narrow, your health care provider may place a soft, flexible tube (stent) inside of it. The stent may be placed days or weeks before your laser therapy procedure. Plan to have someone take you home from the hospital or clinic. If you will be going home right after the procedure, plan to have someone stay with you for 24 hours. Do not use any products that contain nicotine or tobacco for at least 4 weeks before the procedure. These products include cigarettes, e-cigarettes, and chewing tobacco. If you need help quitting, ask your health care provider. Ask your health care provider: How your surgical   site will be marked or identified. What steps will be taken to help prevent infection. These may include: Removing hair at the surgery site. Washing skin with a germ-killing soap. Taking antibiotic  medicine. What happens during the procedure?  An IV will be inserted into one of your veins. You will be given one or more of the following: A medicine to help you relax (sedative). A medicine to numb the area (local anesthetic). A medicine to make you fall asleep (general anesthetic). A ureteroscope will be inserted into your urethra. The ureteroscope will send images to a video screen in the operating room to guide your surgeon to the area of your kidney that will be treated. A small, flexible tube will be threaded through the ureteroscope and into your bladder and ureter, up to your kidney. The laser device will be inserted into your kidney through the tube. Your surgeon will pulse the laser on and off to break up kidney stones. A surgical instrument that has a tiny wire basket may be inserted through the tube into your kidney to remove the pieces of broken kidney stone. The procedure may vary among health care providers and hospitals. What happens after the procedure? Your blood pressure, heart rate, breathing rate, and blood oxygen level will be monitored until you leave the hospital or clinic. You will be given pain medicine as needed. You may continue to receive antibiotics. You may have a stent temporarily placed in your ureter. Do not drive for 24 hours if you were given a sedative during your procedure. You may be given a strainer to collect any stone fragments that you pass in your urine. Your health care provider may have these tested. Summary Laser therapy for kidney stones is a procedure to break up kidney stones into pieces that are small enough to be passed out of the body through urination or removed during the procedure. Follow instructions from your health care provider about eating and drinking before the procedure. During the procedure, the ureteroscope will send images to a video screen to guide your surgeon to the area of your kidney that will be treated. Do not drive  for 24 hours if you were given a sedative during your procedure. This information is not intended to replace advice given to you by your health care provider. Make sure you discuss any questions you have with your healthcare provider. Document Revised: 11/02/2017 Document Reviewed: 11/02/2017 Elsevier Patient Education  2022 [employer]  Ureteral Stent Implantation  Ureteral stent implantation is a procedure to insert (implant) a flexible, soft, plastic tube (stent) into a ureter. Ureters are the tube-like parts of the body that drain urine from the kidneys. The stent supports the ureter while it heals and helps to drain urine. You may have a ureteral stent implanted after having a procedure to remove a blockage from the ureter (ureterolysis or pyeloplasty). You may also have a stent implanted to open the flow of urine when you havea blockage caused by a kidney stone, tumor, blood clot, or infection. You have two ureters, one on each side of the body. The ureters connect the kidneys to the organ that holds urine until it passes out of the body (bladder). The stent is placed so that one end is in the kidney, and one end is in the bladder. The stent is usually taken out after your ureter has healed. Depending on your condition, you may have a stent for just a few weeks, or you may have along-term stent   that will need to be replaced every few months. Tell a health care provider about: Any allergies you have. All medicines you are taking, including vitamins, herbs, eye drops, creams, and over-the-counter medicines. Any problems you or family members have had with anesthetic medicines. Any blood disorders you have. Any surgeries you have had. Any medical conditions you have. Whether you are pregnant or may be pregnant. What are the risks? Generally, this is a safe procedure. However, problems may occur, including: Infection. Bleeding. Allergic reactions to medicines. Damage to other structures or  organs. Tearing (perforation) of the ureter is possible. Movement of the stent away from where it is placed during surgery (migration). What happens before the procedure? Medicines Ask your health care provider about: Changing or stopping your regular medicines. This is especially important if you are taking diabetes medicines or blood thinners. Taking medicines such as aspirin and ibuprofen. These medicines can thin your blood. Do not take these medicines unless your health care provider tells you to take them. Taking over-the-counter medicines, vitamins, herbs, and supplements. Eating and drinking Follow instructions from your health care provider about eating and drinking, which may include: 8 hours before the procedure - stop eating heavy meals or foods, such as meat, fried foods, or fatty foods. 6 hours before the procedure - stop eating light meals or foods, such as toast or cereal. 6 hours before the procedure - stop drinking milk or drinks that contain milk. 2 hours before the procedure - stop drinking clear liquids. Staying hydrated Follow instructions from your health care provider about hydration, which may include: Up to 2 hours before the procedure - you may continue to drink clear liquids, such as water, clear fruit juice, black coffee, and plain tea. General instructions Do not drink alcohol. Do not use any products that contain nicotine or tobacco for at least 4 weeks before the procedure. These products include cigarettes, e-cigarettes, and chewing tobacco. If you need help quitting, ask your health care provider. You may have an exam or testing, such as imaging or blood tests. Ask your health care provider what steps will be taken to help prevent infection. These may include: Removing hair at the surgery site. Washing skin with a germ-killing soap. Taking antibiotic medicine. Plan to have someone take you home from the hospital or clinic. If you will be going home right  after the procedure, plan to have someone with you for 24 hours. What happens during the procedure? An IV will be inserted into one of your veins. You may be given a medicine to help you relax (sedative). You may be given a medicine to make you fall asleep (general anesthetic). A thin, tube-shaped instrument with a light and tiny camera at the end (cystoscope) will be inserted into your urethra. The urethra is the tube that drains urine from the bladder out of the body. In men, the urethra opens at the end of the penis. In women, the urethra opens in front of the vaginal opening. The cystoscope will be passed into your bladder. A thin wire (guide wire) will be passed through your bladder and into your ureter. This is used to guide the stent into your ureter. The stent will be inserted into your ureter. The guide wire and the cystoscope will be removed. A flexible tube (catheter) may be inserted through your urethra so that one end is in your bladder. This helps to drain urine from your bladder. The procedure may vary among hospitals and health care providers.   What happens after the procedure? Your blood pressure, heart rate, breathing rate, and blood oxygen level will be monitored until you leave the hospital or clinic. You may continue to receive medicine and fluids through an IV. You may have some soreness or pain in your abdomen and urethra. Medicines will be available to help you. You will be encouraged to get up and walk around as soon as you can. You may have a catheter draining your urine. You will have some blood in your urine. Do not drive for 24 hours if you were given a sedative during your procedure. Summary Ureteral stent implantation is a procedure to insert a flexible, soft, plastic tube (stent) into a ureter. You may have a stent implanted to support the ureter while it heals after a procedure or to open the flow of urine if there is a blockage. Follow instructions from your  health care provider about taking medicines and about eating and drinking before the procedure. Depending on your condition, you may have a stent for just a few weeks, or you may have a long-term stent that will need to be replaced every few months. This information is not intended to replace advice given to you by your health care provider. Make sure you discuss any questions you have with your healthcare provider. Document Revised: 11/28/2017 Document Reviewed: 11/29/2017 Elsevier Patient Education  2022 Elsevier Inc.  

## 2020-11-11 NOTE — Progress Notes (Signed)
   11/11/2020 12:48 PM   Ilean Skill. 04/06/1975 [id number]  Reason for visit: Follow up Left ureteral stone  HPI: I saw Mr. Callanan for evaluation of a proximal left ureteral stone status post stent placement.  He had multiple ER visits for a 4 mm left proximal ureteral stone over the weekend and ultimately was taken to the OR by Dr. Annabell Howells for attempted ureteroscopy but was unable to access the proximal ureter and a stent was placed.  He has had persistent stent symptoms including some flank pain, and now primarily bladder symptoms with urgency and frequency.  We discussed the need for definitive ureteroscopy in 1 to 2 weeks after passive dilation of the collecting system with the stent in place.  I recommended waiting until the end of next week for a left-sided ureteroscopy, laser lithotripsy, and stent change. We specifically discussed the risks ureteroscopy including bleeding, infection/sepsis, stent related symptoms including flank pain/urgency/frequency/incontinence/dysuria, ureteral injury, inability to access stone, or need for staged or additional procedures.  I also recommended oxybutynin 10 mg XL daily for his overactive symptoms secondary to the stent, continuing Flomax, and pain meds as needed  Schedule left ureteroscopy, laser lithotripsy, stent change next week   Sondra Come, MD  [facility] [address] [place], [place] [address] [phone]

## 2020-11-12 ENCOUNTER — Encounter: Payer: Self-pay | Admitting: Urology

## 2020-11-12 MED ORDER — SODIUM CHLORIDE 0.9 % IR SOLN
Status: DC | PRN
  Administered 2020-11-07: 3000 mL

## 2020-11-17 ENCOUNTER — Other Ambulatory Visit: Payer: Self-pay

## 2020-11-17 ENCOUNTER — Other Ambulatory Visit
Admission: RE | Admit: 2020-11-17 | Discharge: 2020-11-17 | Disposition: A | Discharge status: Home / Self Care | Payer: Self-pay | Source: Ambulatory Visit | Attending: Urology | Admitting: Urology

## 2020-11-17 HISTORY — DX: Essential (primary) hypertension: I10

## 2020-11-17 HISTORY — DX: Other complications of anesthesia, initial encounter: T88.59XA

## 2020-11-17 HISTORY — DX: Other specified postprocedural states: Z98.890

## 2020-11-17 HISTORY — DX: Other intervertebral disc degeneration, lumbar region without mention of lumbar back pain or lower extremity pain: M51.369

## 2020-11-17 HISTORY — DX: Angina pectoris, unspecified: I20.9

## 2020-11-17 HISTORY — DX: Family history of other specified conditions: Z84.89

## 2020-11-17 HISTORY — DX: Nausea with vomiting, unspecified: R11.2

## 2020-11-17 HISTORY — DX: Personal history of urinary calculi: Z87.442

## 2020-11-17 HISTORY — DX: Sleep apnea, unspecified: G47.30

## 2020-11-17 HISTORY — DX: Other intervertebral disc degeneration, lumbar region: M51.36

## 2020-11-17 NOTE — Patient Instructions (Addendum)
Your procedure is scheduled on: 11/20/20 Friday Report to the Registration Desk on the 1st floor of the [facility]. To find out your arrival time, please call [phone] between 1PM - 3PM on: 11/19/20 - Thursday Report to [facility] for EKG on 11/19/20 at 8:30 am.  REMEMBER: Instructions that are not followed completely may result in serious medical risk, up to and including death; or upon the discretion of your surgeon and anesthesiologist your surgery may need to be rescheduled.  Do not eat food or drink any fluids after midnight the night before surgery.  No gum chewing, lozengers or hard candies.  TAKE THESE MEDICATIONS THE MORNING OF SURGERY WITH A SIP OF WATER:  - tamsulosin (FLOMAX) 0.4 MG CAPS capsule  One week prior to surgery: Stop Anti-inflammatories (NSAIDS) such as Advil, Aleve, Ibuprofen, Motrin, Naproxen, Naprosyn and Aspirin based products such as Excedrin, Goodys Powder, BC Powder.  Stop ANY OVER THE COUNTER supplements until after surgery.  You may however, continue to take Tylenol if needed for pain up until the day of surgery.  No Alcohol for 24 hours before or after surgery.  No Smoking including e-cigarettes for 24 hours prior to surgery.  No chewable tobacco products for at least 6 hours prior to surgery.  No nicotine patches on the day of surgery.  Do not use any "recreational" drugs for at least a week prior to your surgery.  Please be advised that the combination of cocaine and anesthesia may have negative outcomes, up to and including death. If you test positive for cocaine, your surgery will be cancelled.  On the morning of surgery brush your teeth with toothpaste and water, you may rinse your mouth with mouthwash if you wish. Do not swallow any toothpaste or mouthwash.  Do not shave body from the neck down 48 hours prior to surgery just in case you cut yourself which could leave a site for infection.  Also, freshly shaved skin may become  irritated if using the CHG soap.  Contact lenses, hearing aids and dentures may not be worn into surgery.  Do not bring valuables to the hospital. [facility] is not responsible for any missing/lost belongings or valuables.   Do not wear jewelry, make-up, hairpins, clips or nail polish.  Do not wear lotions, powders, or perfumes.   Notify your doctor if there is any change in your medical condition (cold, fever, infection).  Wear comfortable clothing (specific to your surgery type) to the hospital.  After surgery, you can help prevent lung complications by doing breathing exercises.  Take deep breaths and cough every 1-2 hours. Your doctor may order a device called an Incentive Spirometer to help you take deep breaths. When coughing or sneezing, hold a pillow firmly against your incision with both hands. This is called "splinting." Doing this helps protect your incision. It also decreases belly discomfort.  If you are being admitted to the hospital overnight, leave your suitcase in the car. After surgery it may be brought to your room.  If you are being discharged the day of surgery, you will not be allowed to drive home. You will need a responsible adult (18 years or older) to drive you home and stay with you that night.   If you are taking public transportation, you will need to have a responsible adult (18 years or older) with you. Please confirm with your physician that it is acceptable to use public transportation.   Please call the Pre-admissions Testing Dept. at [phone] if you have any questions about these instructions.  Surgery Visitation Policy:  Patients undergoing a surgery or procedure may have one family member or support person with them as long as that person is not COVID-19 positive or experiencing its symptoms.  That person may remain in the waiting area during the procedure.  Inpatient Visitation:    Visiting hours are 7 a.m. to 8 p.m. Inpatients will be  allowed two visitors daily. The visitors may change each day during the patient's stay. No visitors under the age of 66. Any visitor under the age of 65 must be accompanied by an adult. The visitor must pass COVID-19 screenings, use hand sanitizer when entering and exiting the patient's room and wear a mask at all times, including in the patient's room. Patients must also wear a mask when staff or their visitor are in the room. Masking is required regardless of vaccination status.

## 2020-11-18 ENCOUNTER — Other Ambulatory Visit: Admission: RE | Admit: 2020-11-18 | Payer: Self-pay | Source: Ambulatory Visit

## 2020-11-18 ENCOUNTER — Encounter
Admission: RE | Admit: 2020-11-18 | Discharge: 2020-11-18 | Disposition: A | Discharge status: Home / Self Care | Payer: Self-pay | Source: Ambulatory Visit | Attending: Urology | Admitting: Urology

## 2020-11-18 ENCOUNTER — Encounter: Payer: Self-pay | Admitting: Urgent Care

## 2020-11-18 DIAGNOSIS — Z01810 Encounter for preprocedural cardiovascular examination: Secondary | ICD-10-CM | POA: Insufficient documentation

## 2020-11-19 ENCOUNTER — Other Ambulatory Visit: Payer: Self-pay

## 2020-12-01 ENCOUNTER — Encounter: Payer: Self-pay | Admitting: Urology

## 2020-12-04 ENCOUNTER — Encounter: Payer: Self-pay | Admitting: Urology

## 2020-12-11 ENCOUNTER — Encounter: Payer: Self-pay | Admitting: Urology

## 2020-12-11 ENCOUNTER — Ambulatory Visit: Payer: PRIVATE HEALTH INSURANCE | Admitting: Anesthesiology

## 2020-12-11 ENCOUNTER — Ambulatory Visit
Admission: RE | Admit: 2020-12-11 | Discharge: 2020-12-11 | Disposition: A | Discharge status: Home / Self Care | Payer: PRIVATE HEALTH INSURANCE | Attending: Urology | Admitting: Urology

## 2020-12-11 ENCOUNTER — Other Ambulatory Visit: Payer: Self-pay

## 2020-12-11 ENCOUNTER — Ambulatory Visit: Payer: PRIVATE HEALTH INSURANCE

## 2020-12-11 ENCOUNTER — Encounter
Admission: RE | Disposition: A | Discharge status: Home / Self Care | Payer: Self-pay | Source: Home / Self Care | Attending: Urology

## 2020-12-11 DIAGNOSIS — N202 Calculus of kidney with calculus of ureter: Secondary | ICD-10-CM | POA: Insufficient documentation

## 2020-12-11 DIAGNOSIS — N201 Calculus of ureter: Secondary | ICD-10-CM

## 2020-12-11 HISTORY — PX: CYSTOSCOPY/URETEROSCOPY/HOLMIUM LASER/STENT PLACEMENT: SHX6546

## 2020-12-11 SURGERY — CYSTOSCOPY/URETEROSCOPY/HOLMIUM LASER/STENT PLACEMENT
Anesthesia: General | Laterality: Left

## 2020-12-11 MED ORDER — ROCURONIUM BROMIDE 10 MG/ML (PF) SYRINGE
PREFILLED_SYRINGE | INTRAVENOUS | Status: AC
  Filled 2020-12-11: qty 10

## 2020-12-11 MED ORDER — ORAL CARE MOUTH RINSE
15.0000 mL | Freq: Once | OROMUCOSAL | Status: AC
Start: 2020-12-11 — End: 2020-12-11

## 2020-12-11 MED ORDER — TRAMADOL HCL 50 MG PO TABS: 50.0000 mg | ORAL_TABLET | ORAL | Status: DC

## 2020-12-11 MED ORDER — ACETAMINOPHEN 10 MG/ML IV SOLN
INTRAVENOUS | Status: AC
  Filled 2020-12-11: qty 100

## 2020-12-11 MED ORDER — CHLORHEXIDINE GLUCONATE 0.12 % MT SOLN: 15.0000 mL | Freq: Once | OROMUCOSAL | Status: AC

## 2020-12-11 MED ORDER — CEFAZOLIN SODIUM-DEXTROSE 2-4 GM/100ML-% IV SOLN
2.0000 g | INTRAVENOUS | Status: AC
  Administered 2020-12-11: 2 g via INTRAVENOUS

## 2020-12-11 MED ORDER — APREPITANT 40 MG PO CAPS: 40.0000 mg | ORAL_CAPSULE | Freq: Once | ORAL | Status: AC

## 2020-12-11 MED ORDER — SODIUM CHLORIDE 0.9 % IR SOLN
Status: DC | PRN
  Administered 2020-12-11: 3000 mL

## 2020-12-11 MED ORDER — MIDAZOLAM HCL 2 MG/2ML IJ SOLN
INTRAMUSCULAR | Status: AC
  Filled 2020-12-11: qty 2

## 2020-12-11 MED ORDER — OXYCODONE HCL 5 MG PO TABS
ORAL_TABLET | ORAL | Status: AC
  Filled 2020-12-11: qty 1

## 2020-12-11 MED ORDER — ROCURONIUM BROMIDE 100 MG/10ML IV SOLN
INTRAVENOUS | Status: DC | PRN
  Administered 2020-12-11: 50 mg via INTRAVENOUS

## 2020-12-11 MED ORDER — DEXAMETHASONE SODIUM PHOSPHATE 10 MG/ML IJ SOLN
INTRAMUSCULAR | Status: AC
  Filled 2020-12-11: qty 1

## 2020-12-11 MED ORDER — TRAMADOL HCL 50 MG PO TABS
ORAL_TABLET | ORAL | Status: AC
  Filled 2020-12-11: qty 1

## 2020-12-11 MED ORDER — LACTATED RINGERS IV SOLN: INTRAVENOUS | Status: DC

## 2020-12-11 MED ORDER — FENTANYL CITRATE (PF) 100 MCG/2ML IJ SOLN: 25.0000 ug | INTRAMUSCULAR | Status: DC | PRN

## 2020-12-11 MED ORDER — PROPOFOL 10 MG/ML IV BOLUS
INTRAVENOUS | Status: AC
  Filled 2020-12-11: qty 20

## 2020-12-11 MED ORDER — FENTANYL CITRATE (PF) 100 MCG/2ML IJ SOLN
INTRAMUSCULAR | Status: AC
  Filled 2020-12-11: qty 2

## 2020-12-11 MED ORDER — KETOROLAC TROMETHAMINE 30 MG/ML IJ SOLN
INTRAMUSCULAR | Status: AC
  Filled 2020-12-11: qty 1

## 2020-12-11 MED ORDER — APREPITANT 40 MG PO CAPS
ORAL_CAPSULE | ORAL | Status: AC
  Administered 2020-12-11: 40 mg via ORAL
  Filled 2020-12-11: qty 1

## 2020-12-11 MED ORDER — KETOROLAC TROMETHAMINE 30 MG/ML IJ SOLN
INTRAMUSCULAR | Status: DC | PRN
  Administered 2020-12-11: 30 mg via INTRAVENOUS

## 2020-12-11 MED ORDER — LIDOCAINE HCL (CARDIAC) PF 100 MG/5ML IV SOSY
PREFILLED_SYRINGE | INTRAVENOUS | Status: DC | PRN
  Administered 2020-12-11: 100 mg via INTRAVENOUS

## 2020-12-11 MED ORDER — CEFAZOLIN SODIUM-DEXTROSE 2-4 GM/100ML-% IV SOLN
INTRAVENOUS | Status: AC
  Filled 2020-12-11: qty 100

## 2020-12-11 MED ORDER — CHLORHEXIDINE GLUCONATE 0.12 % MT SOLN
OROMUCOSAL | Status: AC
  Administered 2020-12-11: 15 mL via OROMUCOSAL
  Filled 2020-12-11: qty 15

## 2020-12-11 MED ORDER — PROPOFOL 500 MG/50ML IV EMUL
INTRAVENOUS | Status: DC | PRN
  Administered 2020-12-11: 150 ug/kg/min via INTRAVENOUS

## 2020-12-11 MED ORDER — SUGAMMADEX SODIUM 500 MG/5ML IV SOLN
INTRAVENOUS | Status: DC | PRN
  Administered 2020-12-11: 200 mg via INTRAVENOUS

## 2020-12-11 MED ORDER — ONDANSETRON HCL 4 MG/2ML IJ SOLN
INTRAMUSCULAR | Status: DC | PRN
  Administered 2020-12-11: 4 mg via INTRAVENOUS

## 2020-12-11 MED ORDER — BELLADONNA ALKALOIDS-OPIUM 16.2-60 MG RE SUPP
RECTAL | Status: DC | PRN
  Administered 2020-12-11: 1 via RECTAL

## 2020-12-11 MED ORDER — IOHEXOL 180 MG/ML  SOLN
INTRAMUSCULAR | Status: DC | PRN
  Administered 2020-12-11: 10 mL

## 2020-12-11 MED ORDER — MIDAZOLAM HCL 2 MG/2ML IJ SOLN
INTRAMUSCULAR | Status: DC | PRN
  Administered 2020-12-11: 2 mg via INTRAVENOUS

## 2020-12-11 MED ORDER — PROMETHAZINE HCL 25 MG/ML IJ SOLN: 6.2500 mg | INTRAMUSCULAR | Status: DC | PRN

## 2020-12-11 MED ORDER — FAMOTIDINE 20 MG PO TABS
ORAL_TABLET | ORAL | Status: AC
  Administered 2020-12-11: 20 mg via ORAL
  Filled 2020-12-11: qty 1

## 2020-12-11 MED ORDER — ACETAMINOPHEN 10 MG/ML IV SOLN
INTRAVENOUS | Status: DC | PRN
  Administered 2020-12-11: 1000 mg via INTRAVENOUS

## 2020-12-11 MED ORDER — ONDANSETRON HCL 4 MG/2ML IJ SOLN: 4.0000 mg | Freq: Once | INTRAMUSCULAR | Status: DC

## 2020-12-11 MED ORDER — HYDROCODONE-ACETAMINOPHEN 5-325 MG PO TABS: 2.0000 | ORAL_TABLET | Freq: Four times a day (QID) | ORAL | 0 refills | Status: AC | PRN

## 2020-12-11 MED ORDER — DEXAMETHASONE SODIUM PHOSPHATE 10 MG/ML IJ SOLN
INTRAMUSCULAR | Status: DC | PRN
  Administered 2020-12-11: 10 mg via INTRAVENOUS

## 2020-12-11 MED ORDER — NITROFURANTOIN MACROCRYSTAL 50 MG PO CAPS: 50.0000 mg | ORAL_CAPSULE | Freq: Every day | ORAL | 0 refills | Status: DC

## 2020-12-11 MED ORDER — OXYBUTYNIN CHLORIDE ER 10 MG PO TB24: 10.0000 mg | ORAL_TABLET | Freq: Every day | ORAL | 1 refills | Status: DC

## 2020-12-11 MED ORDER — BELLADONNA ALKALOIDS-OPIUM 16.2-60 MG RE SUPP
RECTAL | Status: AC
  Filled 2020-12-11: qty 1

## 2020-12-11 MED ORDER — MEPERIDINE HCL 25 MG/ML IJ SOLN: 6.2500 mg | INTRAMUSCULAR | Status: DC | PRN

## 2020-12-11 MED ORDER — FAMOTIDINE 20 MG PO TABS: 20.0000 mg | ORAL_TABLET | Freq: Once | ORAL | Status: AC

## 2020-12-11 MED ORDER — LIDOCAINE HCL (PF) 2 % IJ SOLN
INTRAMUSCULAR | Status: AC
  Filled 2020-12-11: qty 5

## 2020-12-11 MED ORDER — TAMSULOSIN HCL 0.4 MG PO CAPS: ORAL_CAPSULE | ORAL | 1 refills | Status: DC

## 2020-12-11 MED ORDER — PROPOFOL 10 MG/ML IV BOLUS
INTRAVENOUS | Status: DC | PRN
  Administered 2020-12-11: 120 mg via INTRAVENOUS

## 2020-12-11 MED ORDER — FENTANYL CITRATE (PF) 100 MCG/2ML IJ SOLN
INTRAMUSCULAR | Status: DC | PRN
  Administered 2020-12-11: 50 ug via INTRAVENOUS

## 2020-12-11 MED ORDER — ONDANSETRON HCL 4 MG/2ML IJ SOLN
INTRAMUSCULAR | Status: AC
  Filled 2020-12-11: qty 2

## 2020-12-11 SURGICAL SUPPLY — 34 items
BAG DRAIN CYSTO-URO LG1000N (MISCELLANEOUS) ×2 IMPLANT
BRUSH SCRUB EZ 1% IODOPHOR (MISCELLANEOUS) ×2 IMPLANT
CATH URET FLEX-TIP 2 LUMEN 10F (CATHETERS) IMPLANT
CATH URETL OPEN 5X70 (CATHETERS) IMPLANT
CNTNR SPEC 2.5X3XGRAD LEK (MISCELLANEOUS)
CONT SPEC 4OZ STER OR WHT (MISCELLANEOUS)
CONT SPEC 4OZ STRL OR WHT (MISCELLANEOUS)
CONTAINER SPEC 2.5X3XGRAD LEK (MISCELLANEOUS) IMPLANT
DRAPE UTILITY 15X26 TOWEL STRL (DRAPES) ×2 IMPLANT
DRSG TEGADERM 2-3/8X2-3/4 SM (GAUZE/BANDAGES/DRESSINGS) ×2 IMPLANT
GAUZE 4X4 16PLY ~~LOC~~+RFID DBL (SPONGE) ×4 IMPLANT
GLOVE SURG UNDER POLY LF SZ7.5 (GLOVE) ×2 IMPLANT
GOWN STRL REUS W/ TWL LRG LVL3 (GOWN DISPOSABLE) ×1 IMPLANT
GOWN STRL REUS W/ TWL XL LVL3 (GOWN DISPOSABLE) ×1 IMPLANT
GOWN STRL REUS W/TWL LRG LVL3 (GOWN DISPOSABLE) ×2
GOWN STRL REUS W/TWL XL LVL3 (GOWN DISPOSABLE) ×2
GUIDEWIRE STR DUAL SENSOR (WIRE) ×4 IMPLANT
INFUSOR MANOMETER BAG 3000ML (MISCELLANEOUS) ×2 IMPLANT
IV NS IRRIG 3000ML ARTHROMATIC (IV SOLUTION) ×2 IMPLANT
KIT TURNOVER CYSTO (KITS) ×2 IMPLANT
PACK CYSTO AR (MISCELLANEOUS) ×2 IMPLANT
SCOPE LITHOVU DISP 9.5FR 7.7FR (UROLOGICAL SUPPLIES) ×1 IMPLANT
SCOPE LITHOVUE DISPOSABLE (UROLOGICAL SUPPLIES) ×2
SET CYSTO W/LG BORE CLAMP LF (SET/KITS/TRAYS/PACK) ×2 IMPLANT
SHEATH URETERAL 12FRX35CM (MISCELLANEOUS) IMPLANT
STENT URET 6FRX24 CONTOUR (STENTS) IMPLANT
STENT URET 6FRX26 CONTOUR (STENTS) ×2 IMPLANT
SURGILUBE 2OZ TUBE FLIPTOP (MISCELLANEOUS) ×2 IMPLANT
SYR 10ML LL (SYRINGE) ×2 IMPLANT
TRACTIP FLEXIVA PULSE ID 200 (Laser) IMPLANT
VALVE UROSEAL ADJ ENDO (VALVE) IMPLANT
WATER STERILE IRR 1000ML POUR (IV SOLUTION) ×2 IMPLANT
WATER STERILE IRR 500ML POUR (IV SOLUTION) ×2 IMPLANT
lithovue ×2 IMPLANT

## 2020-12-11 NOTE — Discharge Instructions (Signed)

## 2020-12-11 NOTE — Transfer of Care (Signed)
Immediate Anesthesia Transfer of Care Note  Patient: Curtis Brown.  Procedure(s) Performed: CYSTOSCOPY/URETEROSCOPY/HOLMIUM LASER/STENT EXCHANGE (Left)  Patient Location: PACU  Anesthesia Type:General  Level of Consciousness: awake  Airway & Oxygen Therapy: Patient Spontanous Breathing and Patient connected to face mask oxygen  Post-op Assessment: Report given to RN and Post -op Vital signs reviewed and stable  Post vital signs: Reviewed and stable  Last Vitals:  Vitals Value Taken Time  BP 127/92   Temp    Pulse 68 12/11/20 1617  Resp 20 12/11/20 1617  SpO2 100 % 12/11/20 1617  Vitals shown include unvalidated device data.  Last Pain:  Vitals:   12/11/20 1325  PainSc: 5          Complications: No notable events documented.

## 2020-12-11 NOTE — Op Note (Signed)
Date of procedure: 12/11/20  Preoperative diagnosis:  Left proximal ureteral stone Left renal stone  Postoperative diagnosis:  Same  Procedure: Cystoscopy, left retrograde pyelogram with intraoperative interpretation, left ureteral stent placement Left ureteroscopy, laser lithotripsy of left proximal ureteral stone Left ureteroscopy, laser lithotripsy of left renal stone  Surgeon: Nickolas Madrid, MD  Anesthesia: General  Complications: None  Intraoperative findings:  Normal cystoscopy Stone remained impacted in a very tight proximal ureter Uncomplicated dusting of 4 mm proximal ureteral stone and small lower pole stone  EBL: Minimal  Specimens: None  Drains: Left 6 French by 26 cm ureteral stent with Dangler  Indication: Curtis Glad. is a 45 y.o. patient with 4 mm left proximal ureteral stent who previously underwent stent placement for inability to access collecting system with Dr. Jeffie Brown, he presents today for definitive management.  After reviewing the management options for treatment, they elected to proceed with the above surgical procedure(s). We have discussed the potential benefits and risks of the procedure, side effects of the proposed treatment, the likelihood of the patient achieving the goals of the procedure, and any potential problems that might occur during the procedure or recuperation. Informed consent has been obtained.  Description of procedure:  The patient was taken to the operating room and general anesthesia was induced. SCDs were placed for DVT prophylaxis. The patient was placed in the dorsal lithotomy position, prepped and draped in the usual sterile fashion, and preoperative antibiotics(Ancef) were administered. A preoperative time-out was performed.   A 21 French rigid cystoscope was used to intubate the urethra and a normal-appearing urethra was followed proximally into the bladder.  Thorough cystoscopy was performed and was grossly normal.  There  was some erythema around the left ureteral orifice from the stent.  A sensor wire advanced easily alongside the stent up to the kidney under fluoroscopic vision.  The stent was then grasped and pulled to the meatus and a second sensor safety wire was added through the old stent, and the stent removed.  A single channel digital ureteroscope was unavailable, and I used the [employer] single channel disposable flexible ureteroscope and this advanced over the wire up to the proximal ureter where it met resistance.  Under direct vision I was able to visualize a 4 mm yellow impacted stone in the proximal ureter.  The 242 m laser fiber and settings of 0.3 J and 60 Hz was used to carefully dust the stone.    I was able to advance the scope easily up into the kidney and thorough pyeloscopy was performed.  There was a 3 mm left lower pole stone that was dusted on previously mentioned settings.  Thorough pyeloscopy revealed no other stone fragments.  Retrograde pyelogram was performed from the proximal ureter and showed no extravasation or filling defects.  Careful pullback ureteroscopy showed no abnormalities.  The rigid cystoscope was backloaded over the wire and a 6 [place] by 26 cm ureteral stent with Dangler attached was uneventfully placement excellent curl in the renal pelvis as well as in the bladder.  Bladder was drained, belladonna suppository was placed, the Dangler was secured to the penis using Mastisol and Tegaderm, and this concluded our procedure.  Disposition: Stable to PACU  Plan: Remove stent at home on Tuesday morning, nitrofurantoin prophylaxis with stent in place Follow-up in 6 weeks with renal ultrasound prior to evaluate for silent hydronephrosis with impacted stone  Nickolas Madrid, MD

## 2020-12-11 NOTE — Anesthesia Procedure Notes (Signed)
Procedure Name: Intubation Date/Time: 12/11/2020 3:26 PM Performed by: Irving Burton, CRNA Pre-anesthesia Checklist: Patient identified, Patient being monitored, Timeout performed, Emergency Drugs available and Suction available Patient Re-evaluated:Patient Re-evaluated prior to induction Oxygen Delivery Method: Circle system utilized Preoxygenation: Pre-oxygenation with 100% oxygen Induction Type: IV induction Ventilation: Mask ventilation without difficulty Laryngoscope Size: McGraph and 4 Grade View: Grade I Tube type: Oral Tube size: 7.5 mm Number of attempts: 1 Airway Equipment and Method: Stylet and Video-laryngoscopy Placement Confirmation: ETT inserted through vocal cords under direct vision, positive ETCO2 and breath sounds checked- equal and bilateral Secured at: 22 cm Tube secured with: Tape Dental Injury: Teeth and Oropharynx as per pre-operative assessment

## 2020-12-11 NOTE — Anesthesia Preprocedure Evaluation (Signed)
Anesthesia Evaluation  Patient identified by MRN, date of birth, ID band Patient awake    Reviewed: Allergy & Precautions, NPO status , Patient's Chart, lab work & pertinent test results  History of Anesthesia Complications (+) PONV and history of anesthetic complications  Airway Mallampati: II  TM Distance: >3 FB Neck ROM: Full    Dental  (+) Poor Dentition   Pulmonary sleep apnea , neg COPD,    breath sounds clear to auscultation- rhonchi (-) wheezing      Cardiovascular hypertension, Pt. on medications (-) CAD, (-) Past MI, (-) Cardiac Stents and (-) CABG  Rhythm:Regular Rate:Normal - Systolic murmurs and - Diastolic murmurs    Neuro/Psych neg Seizures negative neurological ROS  negative psych ROS   GI/Hepatic negative GI ROS, Neg liver ROS,   Endo/Other  negative endocrine ROSneg diabetes  Renal/GU Renal disease (nephrolithiasis)     Musculoskeletal  (+) Arthritis ,   Abdominal (+) - obese,   Peds  Hematology negative hematology ROS (+)   Anesthesia Other Findings Past Medical History: No date: Anginal pain (HCC) No date: Complication of anesthesia No date: DDD (degenerative disc disease), lumbar No date: Family history of adverse reaction to anesthesia     Comment:  PONV No date: History of kidney stones No date: Hypertension     Comment:  diet controlled No date: Kidney stone No date: PONV (postoperative nausea and vomiting) No date: Sleep apnea     Comment:  no CPAP   Reproductive/Obstetrics                             Anesthesia Physical Anesthesia Plan  ASA: 2  Anesthesia Plan: General   Post-op Pain Management:    Induction: Intravenous  PONV Risk Score and Plan: 2 and Ondansetron, Dexamethasone, TIVA and Midazolam  Airway Management Planned: Oral ETT  Additional Equipment:   Intra-op Plan:   Post-operative Plan: Extubation in OR  Informed Consent: I  have reviewed the patients History and Physical, chart, labs and discussed the procedure including the risks, benefits and alternatives for the proposed anesthesia with the patient or authorized representative who has indicated his/her understanding and acceptance.     Dental advisory given  Plan Discussed with: CRNA and Anesthesiologist  Anesthesia Plan Comments:         Anesthesia Quick Evaluation

## 2020-12-11 NOTE — H&P (Signed)
   12/11/20 3:04 PM   Curtis Brown. 04-Dec-1975 [id number]  CC: Left ureteral stone  HPI: 45 year old male who previously presented with a 4 mm left proximal ureteral stone and poorly controlled pain.  He underwent attempted ureteroscopy with Dr. Annabell Howells, but the ureteroscope was unable to be advanced into the left kidney and a stent was placed.  He has had severe stent symptoms of urgency, frequency, dysuria, and feeling of incomplete emptying.  Urine culture negative.  He presents today for definitive management with ureteroscopy and laser lithotripsy.   PMH: Past Medical History:  Diagnosis Date   Anginal pain (HCC)    Complication of anesthesia    DDD (degenerative disc disease), lumbar    Family history of adverse reaction to anesthesia    PONV   History of kidney stones    Hypertension    diet controlled   Kidney stone    PONV (postoperative nausea and vomiting)    Sleep apnea    no CPAP    Surgical History: Past Surgical History:  Procedure Laterality Date   arthroscopic knee Left 2015   x 2 surgeries   CYSTOSCOPY W/ URETERAL STENT PLACEMENT Left 11/07/2020   Procedure: CYSTOSCOPY WITH RETROGRADE PYELOGRAM/URETERAL STENT PLACEMENT;  Surgeon: Bjorn Pippin, MD;  Location: [facility] ORS;  Service: Urology;  Laterality: Left;   FRACTURE SURGERY     tibia with rod 2000   HERNIA REPAIR     INGUINAL HERNIA REPAIR     LEG SURGERY       Family History: History reviewed. No pertinent family history.  Social History:  reports that he has never smoked. He has never used smokeless tobacco. He reports current alcohol use. He reports that he does not use drugs.  Physical Exam: BP (!) 156/108   Pulse 78   Resp 18   Ht 5\' 10"  (1.778 m)   Wt 88.9 kg   SpO2 99%   BMI 28.12 kg/m    Constitutional:  Alert and oriented, No acute distress. Cardiovascular: Regular rate and rhythm Respiratory: Clear to auscultation bilaterally GI: Abdomen is soft, nontender, nondistended, no  abdominal masses   Laboratory Data: Urine culture 9/2 no growth  Assessment & Plan:   45 year old male with 4 mm left ureteral stone previously stented for inability to access the collecting system.  Here today for definitive management with ureteroscopy.  He has been having severe stent related symptoms, and requests no stent be placed if at all possible after ureteroscopy today.  We specifically discussed the risks ureteroscopy including bleeding, infection/sepsis, stent related symptoms including flank pain/urgency/frequency/incontinence/dysuria, ureteral injury, inability to access stone, or need for staged or additional procedures.  Left ureteroscopy, laser lithotripsy, possible stent placement today   55, MD 12/11/2020  [facility] [address] [place], [place] [phone]

## 2020-12-12 ENCOUNTER — Emergency Department
Admission: EM | Admit: 2020-12-12 | Discharge: 2020-12-12 | Disposition: A | Discharge status: Home / Self Care | Payer: PRIVATE HEALTH INSURANCE | Attending: Student in an Organized Health Care Education/Training Program | Admitting: Student in an Organized Health Care Education/Training Program

## 2020-12-12 ENCOUNTER — Emergency Department: Payer: PRIVATE HEALTH INSURANCE

## 2020-12-12 ENCOUNTER — Other Ambulatory Visit: Payer: Self-pay

## 2020-12-12 DIAGNOSIS — R300 Dysuria: Secondary | ICD-10-CM | POA: Insufficient documentation

## 2020-12-12 DIAGNOSIS — I10 Essential (primary) hypertension: Secondary | ICD-10-CM | POA: Insufficient documentation

## 2020-12-12 DIAGNOSIS — R109 Unspecified abdominal pain: Secondary | ICD-10-CM | POA: Diagnosis not present

## 2020-12-12 LAB — BASIC METABOLIC PANEL
Anion gap: 6 (ref 5–15)
BUN: 22 mg/dL — ABNORMAL HIGH (ref 6–20)
CO2: 27 mmol/L (ref 22–32)
Calcium: 9.3 mg/dL (ref 8.9–10.3)
Chloride: 106 mmol/L (ref 98–111)
Creatinine, Ser: 1.05 mg/dL (ref 0.61–1.24)
GFR, Estimated: >60 mL/min (ref >60)
Glucose, Bld: 111 mg/dL — ABNORMAL HIGH (ref 70–99)
Potassium: 3.6 mmol/L (ref 3.5–5.1)
Sodium: 139 mmol/L (ref 135–145)

## 2020-12-12 LAB — URINALYSIS, COMPLETE (UACMP) WITH MICROSCOPIC
RBC / HPF: >50 RBC/hpf — ABNORMAL HIGH (ref 0–5)
Specific Gravity, Urine: 1.027 (ref 1.005–1.030)
Squamous Epithelial / HPF: NONE SEEN (ref 0–5)
WBC, UA: >50 WBC/hpf — ABNORMAL HIGH (ref 0–5)

## 2020-12-12 LAB — CBC
HCT: 40.5 % (ref 39.0–52.0)
Hemoglobin: 14.2 g/dL (ref 13.0–17.0)
MCH: 29.7 pg (ref 26.0–34.0)
MCHC: 35.1 g/dL (ref 30.0–36.0)
MCV: 84.7 fL (ref 80.0–100.0)
Platelets: 195 10*3/uL (ref 150–400)
RBC: 4.78 MIL/uL (ref 4.22–5.81)
RDW: 12.2 % (ref 11.5–15.5)
WBC: 10.8 10*3/uL — ABNORMAL HIGH (ref 4.0–10.5)
nRBC: 0 % (ref 0.0–0.2)

## 2020-12-12 IMAGING — US US RENAL
1 series · 14 of 25 positions shown · non-contrast
Comparison: [DATE], [DATE]

CLINICAL DATA: Recent left renal stent placement

EXAM:
RENAL / URINARY TRACT ULTRASOUND COMPLETE

[Series 1: us renal · 14 of 59 slices shown]
[im 1/59]
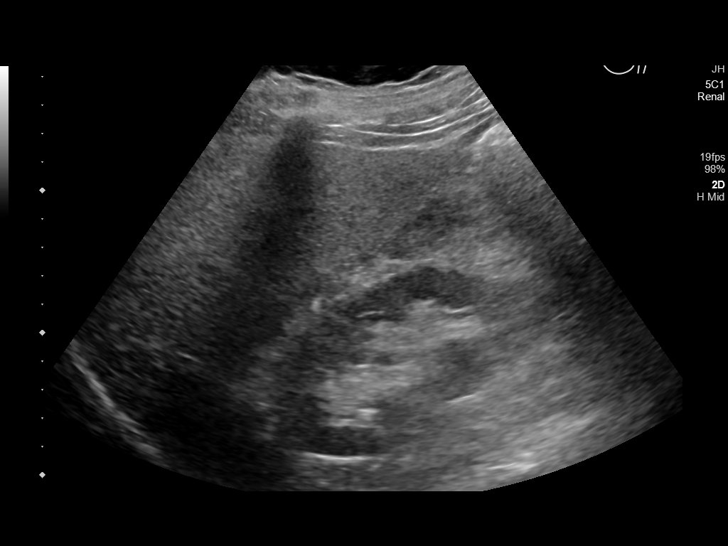
[im 5/59]
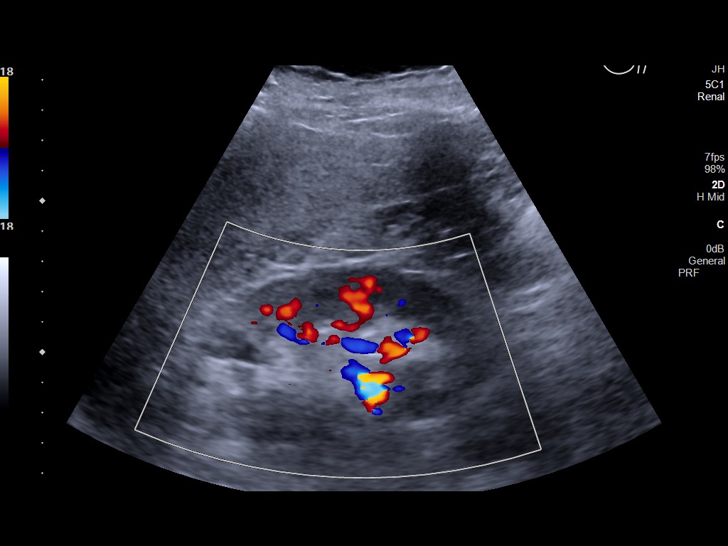
[im 10/59]
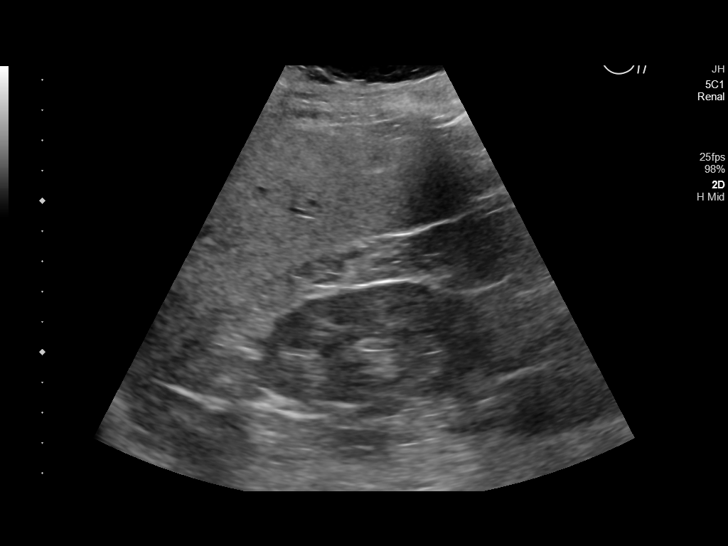
[im 15/59]
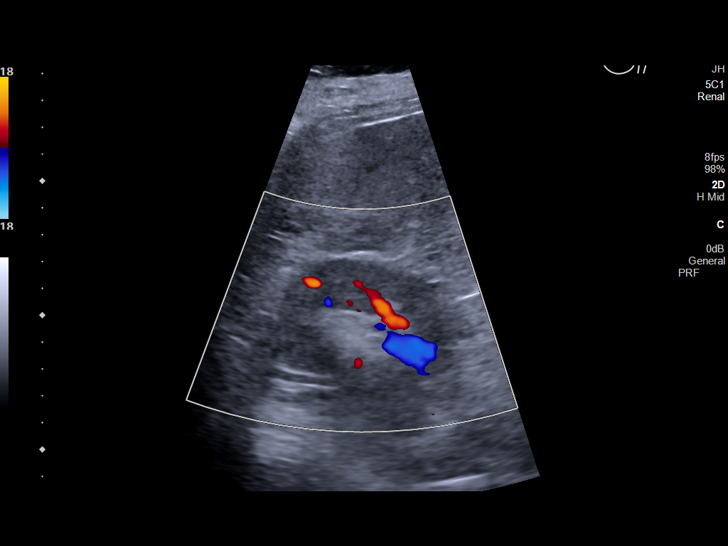
[im 20/59]
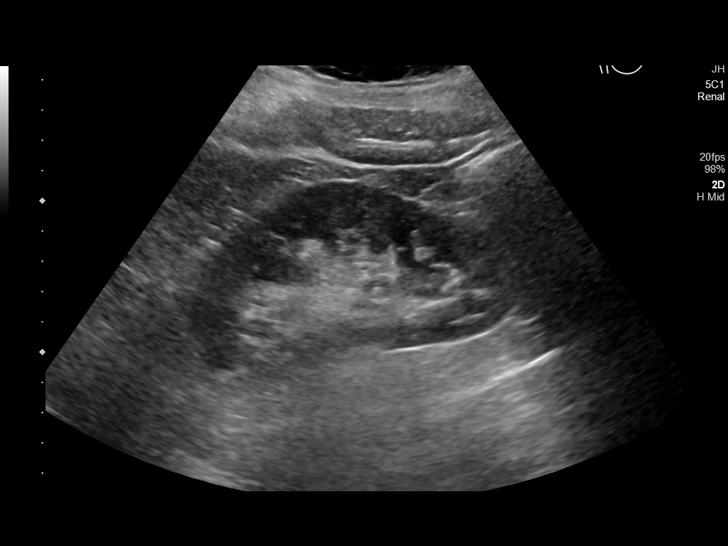
[im 22/59]
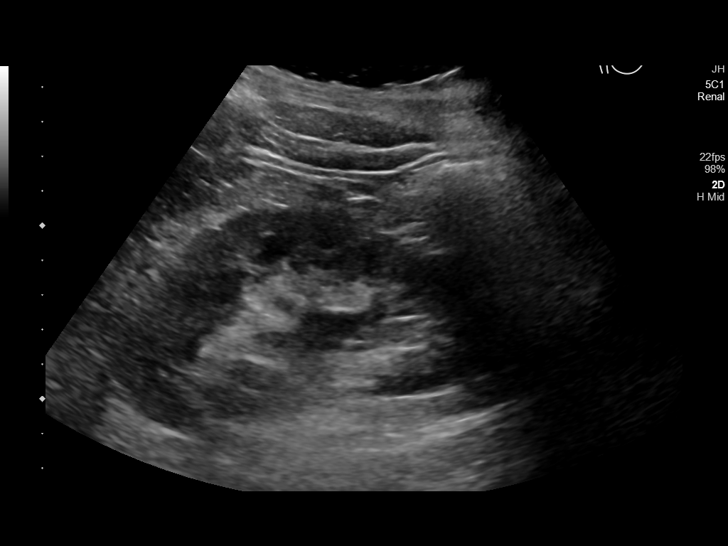
[im 27/59]
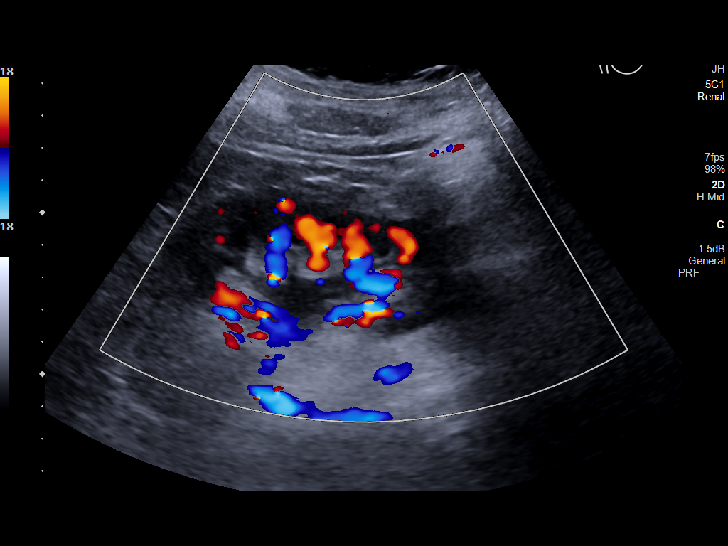
[im 32/59]
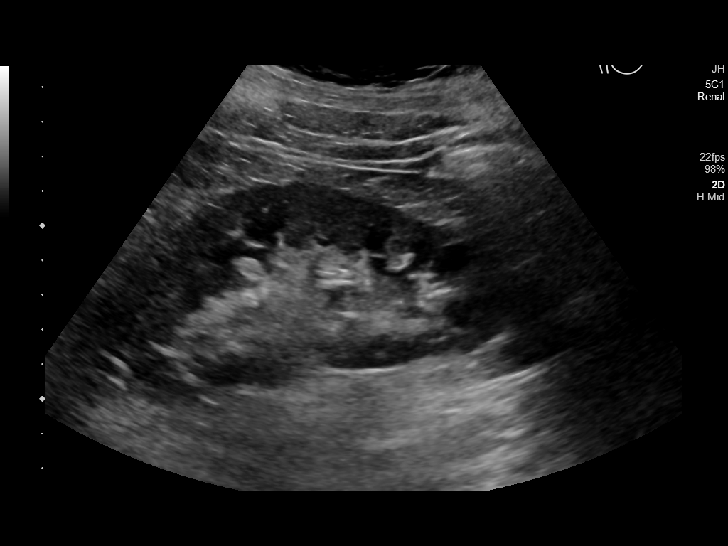
[im 37/59]
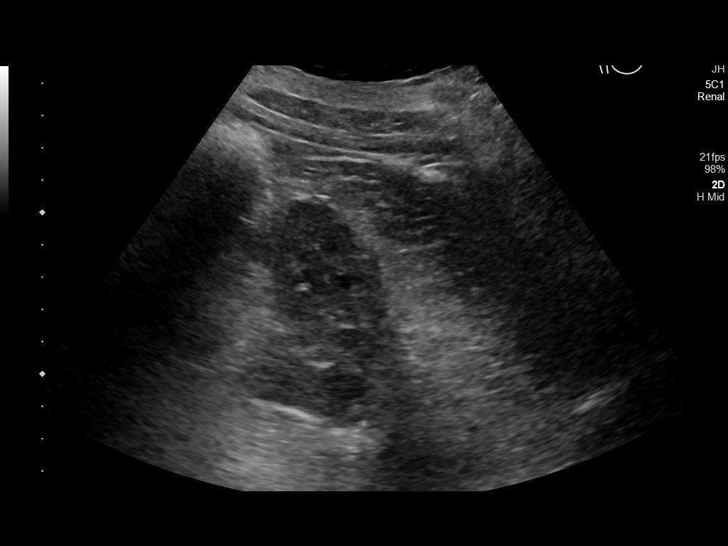
[im 39/59]
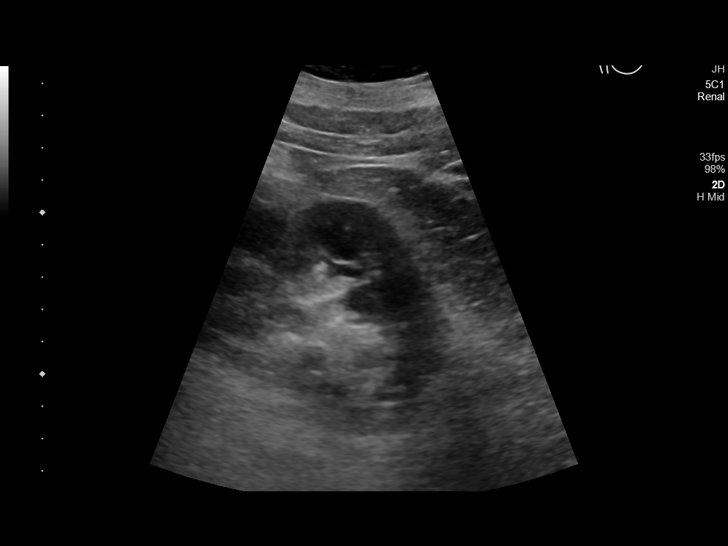
[im 44/59]
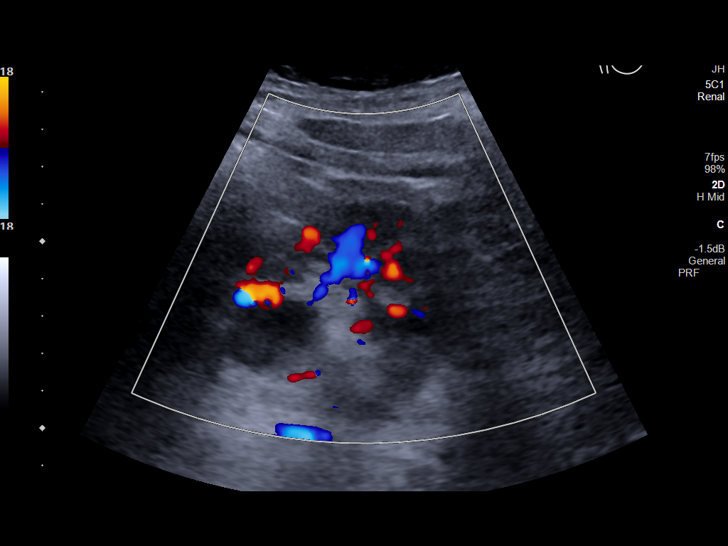
[im 49/59]
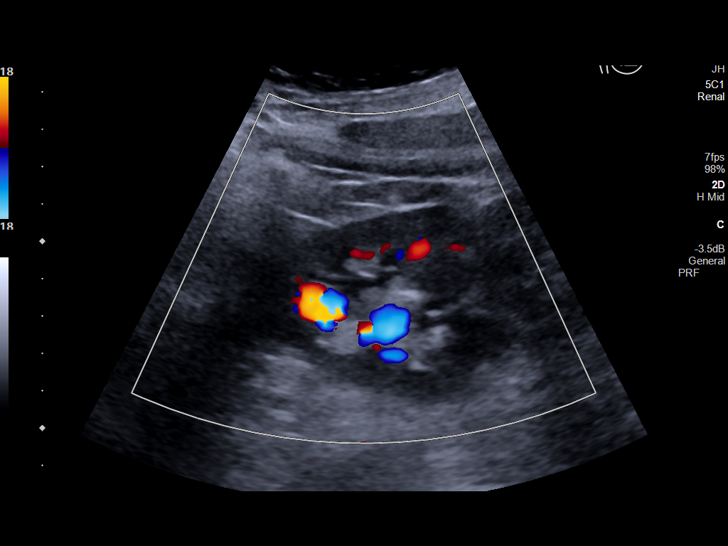
[im 54/59]
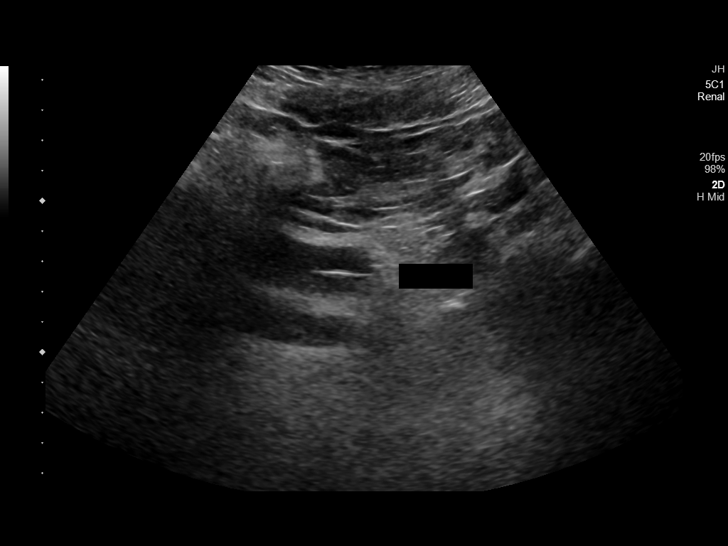
[im 59/59]
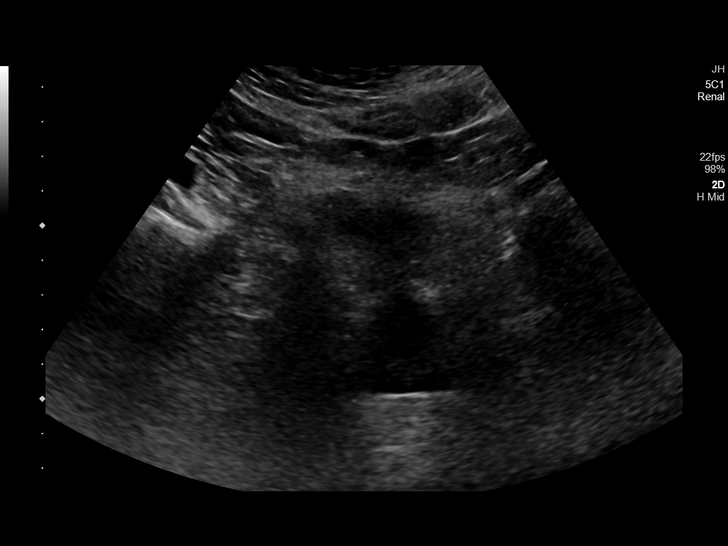

[14 of 25 positions shown; findings below may reference images not displayed]

FINDINGS: Right Kidney:

Renal measurements: 10.2 x 6.1 x 7.2 cm = volume: 237 mL.
Echogenicity within normal limits. No mass or hydronephrosis
visualized.

Left Kidney:

Renal measurements: 11.4 x 6.1 by 5.7 cm = volume: 206 mL.
Echogenicity within normal limits. 1.7 cm cyst lower pole left
kidney. There is mild left hydronephrosis. The recently placed left
ureteral stent is not identified by ultrasound.

Bladder:

Bladder is decompressed, with the distal aspect of the left ureteral
stent identified within the bladder lumen.

Other:

None.
IMPRESSION: 1. Mild left-sided hydronephrosis. The proximal aspect of the left
ureteral stent cannot be visualized, though the distal aspect is
identified within the bladder lumen.
2. Simple left renal cyst.
3. Unremarkable right kidney.

## 2020-12-12 IMAGING — DX DG ABDOMEN 1V
2 series · 2 of 2 positions shown · non-contrast
Comparison: [DATE], [DATE]

CLINICAL DATA: Renal calculus, hematuria, pain

EXAM:
ABDOMEN - 1 VIEW

[abdomen supine (1 of 2)]
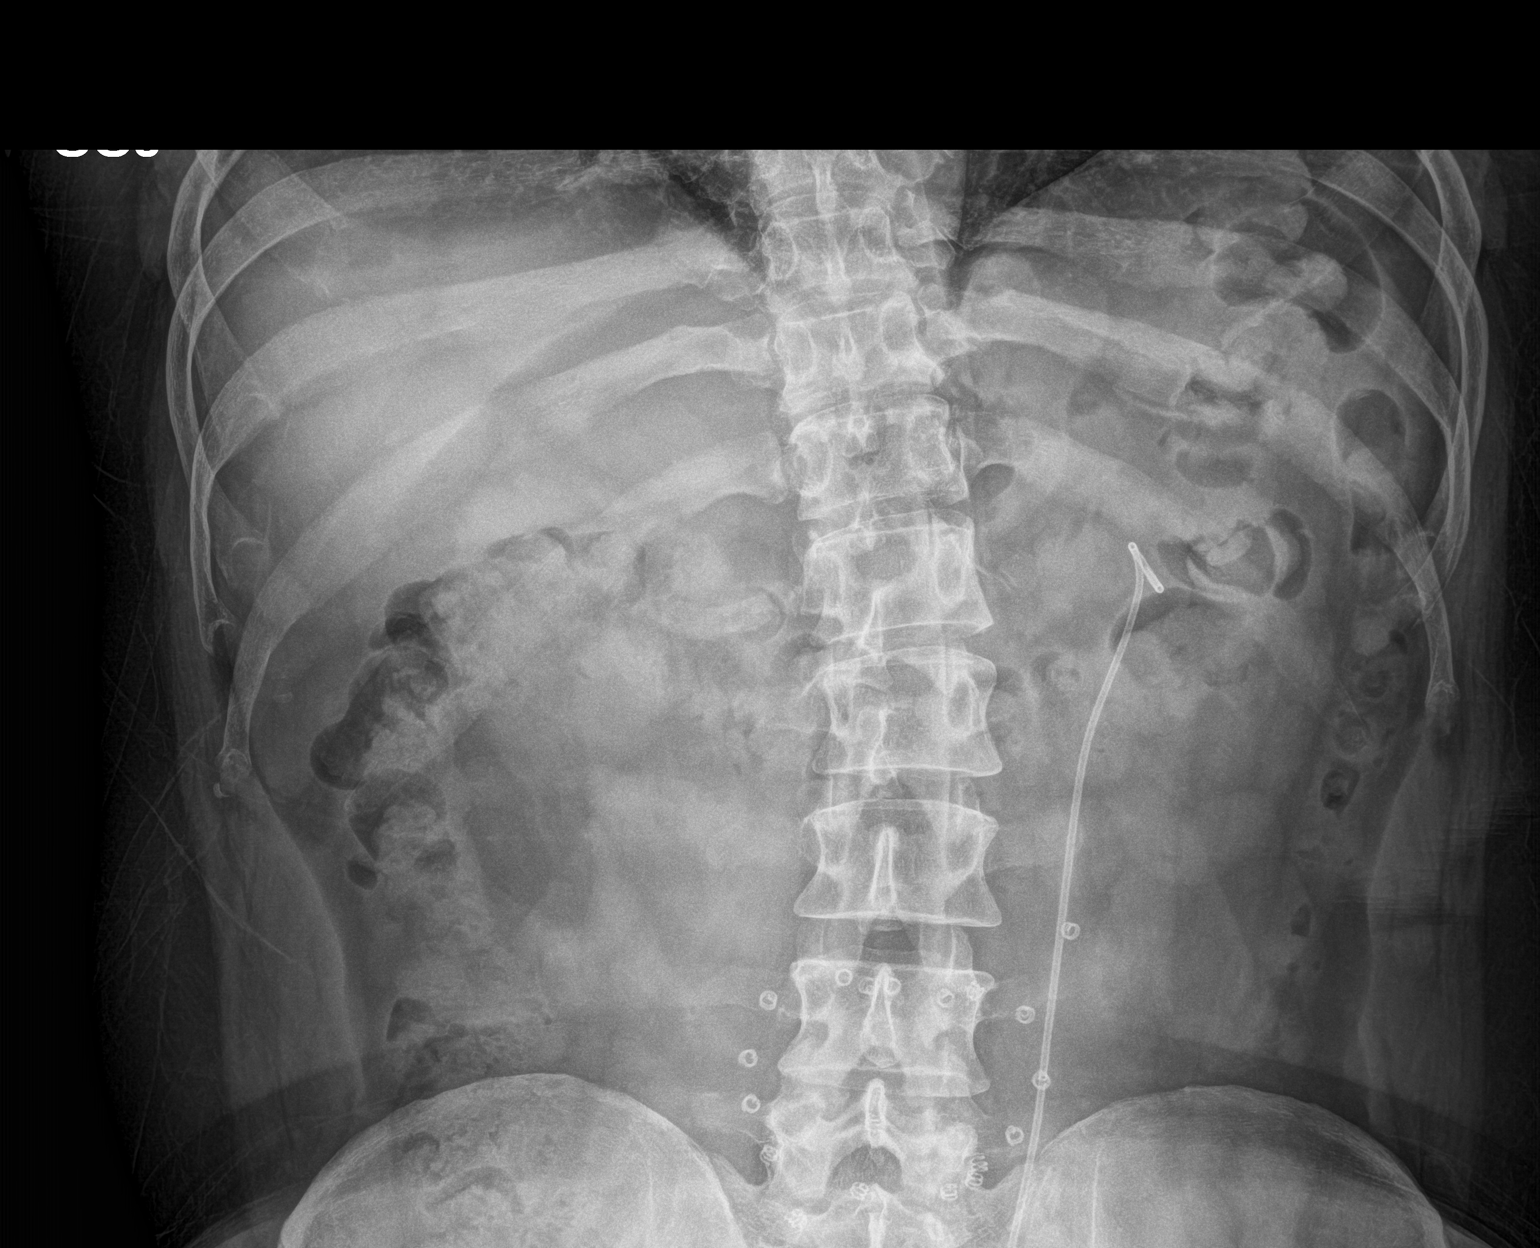

[abdomen supine (2 of 2)]
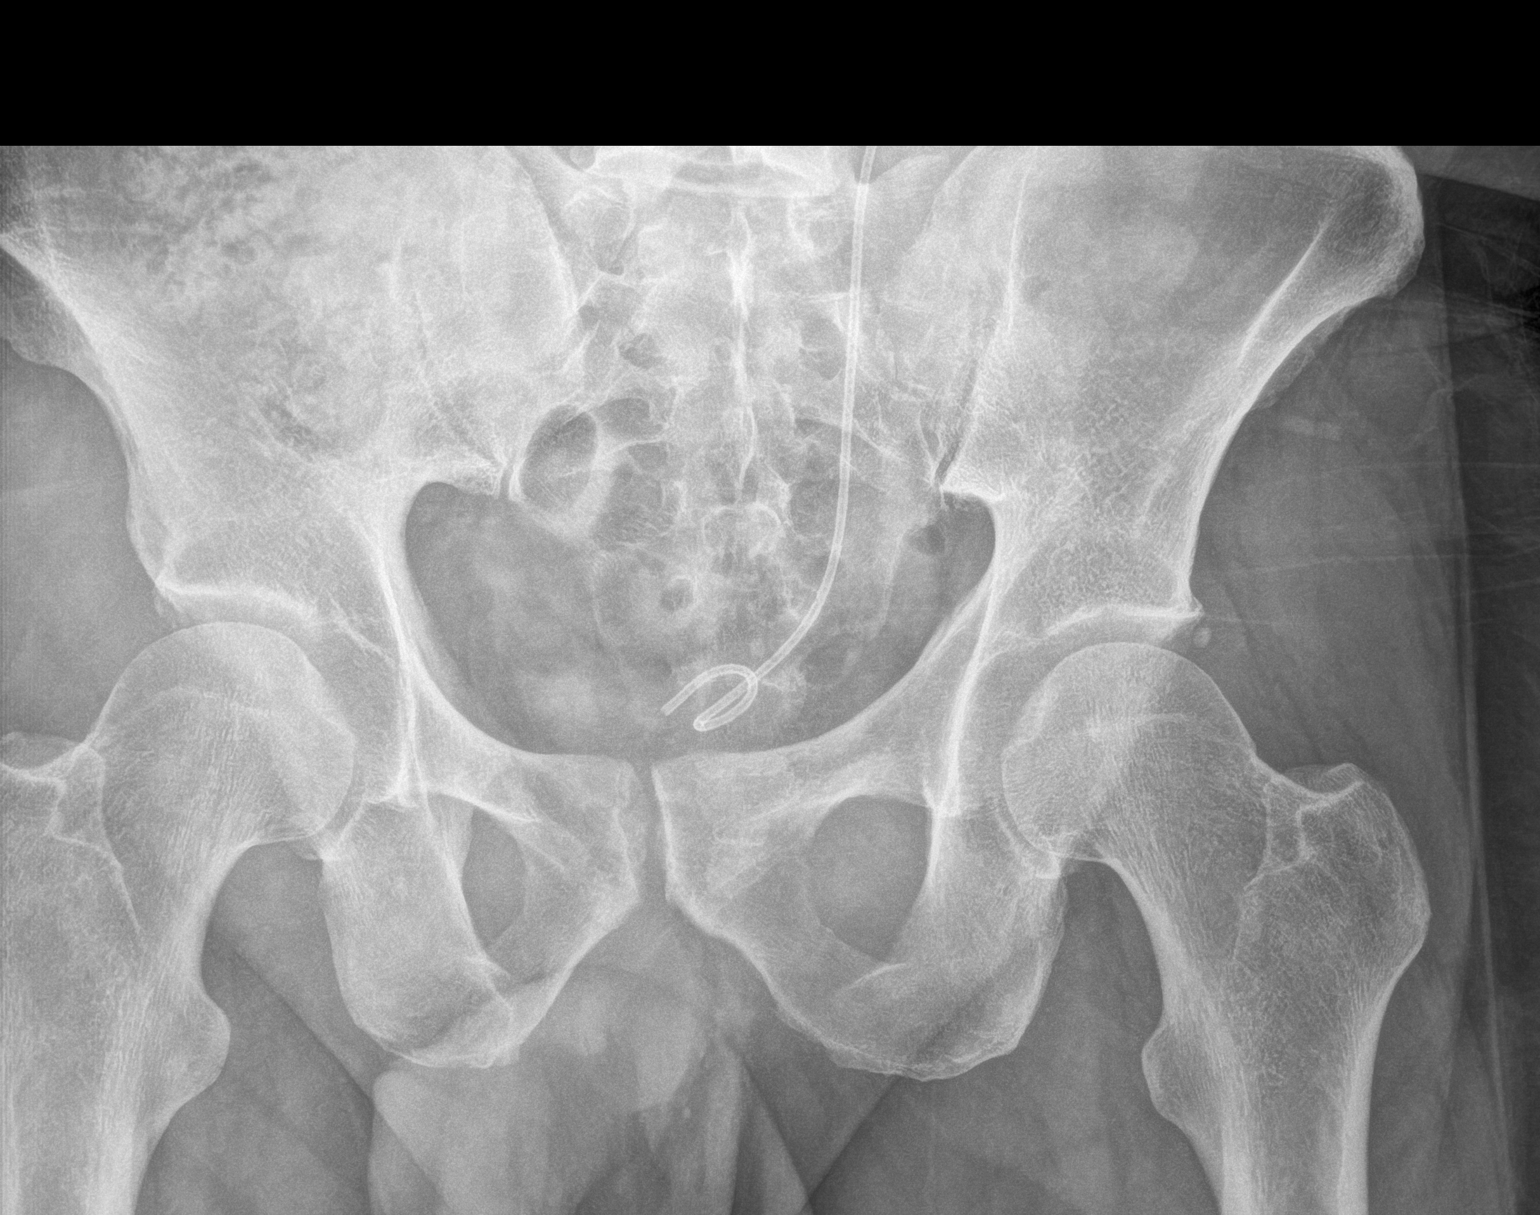

[2 of 2 positions shown; findings below may reference images not displayed]

FINDINGS: Two supine frontal views of the abdomen and pelvis demonstrate left
ureteral stent extending from the region of the left renal
silhouette to the lower pelvis. No radiopaque calculi are
identified. Bowel gas pattern is unremarkable. Postsurgical changes
from prior ventral hernia repair. No acute bony abnormalities.
IMPRESSION: 1. Left ureteral stent unchanged since intraoperative exam
[DATE]. No radiopaque calculi.

## 2020-12-12 MED ORDER — OXYBUTYNIN CHLORIDE ER 10 MG PO TB24
10.0000 mg | ORAL_TABLET | Freq: Every day | ORAL | Status: DC
  Administered 2020-12-12: 10 mg via ORAL
  Filled 2020-12-12: qty 1

## 2020-12-12 MED ORDER — FENTANYL CITRATE PF 50 MCG/ML IJ SOSY
100.0000 ug | PREFILLED_SYRINGE | INTRAMUSCULAR | Status: DC | PRN
Start: 2020-12-12 — End: 2020-12-13
  Administered 2020-12-12: 100 ug via INTRAVENOUS
  Filled 2020-12-12: qty 2

## 2020-12-12 MED ORDER — OXYBUTYNIN CHLORIDE ER 10 MG PO TB24: 10.0000 mg | ORAL_TABLET | Freq: Every day | ORAL | 0 refills | Status: DC

## 2020-12-12 MED ORDER — KETOROLAC TROMETHAMINE 30 MG/ML IJ SOLN
15.0000 mg | Freq: Once | INTRAMUSCULAR | Status: AC
  Administered 2020-12-12: 15 mg via INTRAVENOUS
  Filled 2020-12-12: qty 1

## 2020-12-12 NOTE — ED Provider Notes (Signed)
[facility] Emergency Department Provider Note    Event Date/Time   First MD Initiated Contact with Patient 12/12/20 1745     (approximate)  I have reviewed the triage vital signs and the nursing notes.   HISTORY  Chief Complaint Hematuria    HPI Chevelle Durr. is a 45 y.o. male below listed past medical history status post recent stent placement for ureteral stone presents to the ER for flank pain as well as dysuria and spasms.  States the pain has subsided.  Past Medical History:  Diagnosis Date   Anginal pain (HCC)    Complication of anesthesia    DDD (degenerative disc disease), lumbar    Family history of adverse reaction to anesthesia    PONV   History of kidney stones    Hypertension    diet controlled   Kidney stone    PONV (postoperative nausea and vomiting)    Sleep apnea    no CPAP   No family history on file. Past Surgical History:  Procedure Laterality Date   arthroscopic knee Left 2015   x 2 surgeries   CYSTOSCOPY W/ URETERAL STENT PLACEMENT Left 11/07/2020   Procedure: CYSTOSCOPY WITH RETROGRADE PYELOGRAM/URETERAL STENT PLACEMENT;  Surgeon: Bjorn Pippin, MD;  Location: [facility] ORS;  Service: Urology;  Laterality: Left;   FRACTURE SURGERY     tibia with rod 2000   HERNIA REPAIR     INGUINAL HERNIA REPAIR     LEG SURGERY     Patient Active Problem List   Diagnosis Date Noted   Left ureteral stone 11/07/2020   Unstable angina (HCC) 11/01/2017      Prior to Admission medications   Medication Sig Start Date End Date Taking? Authorizing Provider  acetaminophen (TYLENOL) 325 MG tablet Take 650 mg by mouth every 6 (six) hours as needed for moderate pain.    [provider]  docusate sodium (COLACE) 100 MG capsule Take 1 tablet once or twice daily as needed for constipation while taking narcotic pain medicine 11/06/20   Loleta Rose, MD  HYDROcodone-acetaminophen (NORCO/VICODIN) 5-325 MG tablet Take 2 tablets by mouth  every 6 (six) hours as needed for up to 5 days for moderate pain or severe pain. 12/11/20 12/16/20  Sondra Come, MD  ibuprofen (ADVIL) 200 MG tablet Take 400 mg by mouth every 6 (six) hours as needed for moderate pain.    [provider]  nitrofurantoin (MACRODANTIN) 50 MG capsule Take 1 capsule (50 mg total) by mouth daily. 12/11/20   Sondra Come, MD  oxybutynin (DITROPAN-XL) 10 MG 24 hr tablet Take 1 tablet (10 mg total) by mouth daily. 12/12/20   Willy Eddy, MD  tamsulosin [facility]) 0.4 MG CAPS capsule Take 1 tablet by mouth daily until you pass the kidney stone or no longer have symptoms 12/11/20   Sondra Come, MD    Allergies Oxycodone-acetaminophen and Percocet [oxycodone-acetaminophen]    Social History Social History   Tobacco Use   Smoking status: Never   Smokeless tobacco: Never  Substance Use Topics   Alcohol use: Yes    Comment: occassionally   Drug use: No    Review of Systems Patient denies headaches, rhinorrhea, blurry vision, numbness, shortness of breath, chest pain, edema, cough, abdominal pain, nausea, vomiting, diarrhea, dysuria, fevers, rashes or hallucinations unless otherwise stated above in HPI. ____________________________________________   PHYSICAL EXAM:  VITAL SIGNS: Vitals:   12/12/20 1626 12/12/20 1840  BP: (!) 159/106 (!) 145/109  Pulse: 77 70  Resp: 18 17  Temp: 98.1 F (36.7 C)   SpO2: 99% 100%    Constitutional: Alert and oriented.  Eyes: Conjunctivae are normal.  Head: Atraumatic. Nose: No congestion/rhinnorhea. Mouth/Throat: Mucous membranes are moist.   Neck: No stridor. Painless ROM.  Cardiovascular: Normal rate, regular rhythm. Grossly normal heart sounds.  Good peripheral circulation. Respiratory: Normal respiratory effort.  No retractions. Lungs CTAB. Gastrointestinal: Soft and nontender. No distention. No abdominal bruits. No CVA tenderness. Genitourinary:  Musculoskeletal: No lower extremity  tenderness nor edema.  No joint effusions. Neurologic:  Normal speech and language. No gross focal neurologic deficits are appreciated. No facial droop Skin:  Skin is warm, dry and intact. No rash noted. Psychiatric: Mood and affect are normal. Speech and behavior are normal.  ____________________________________________   LABS (all labs ordered are listed, but only abnormal results are displayed)  Results for orders placed or performed during the hospital encounter of 12/12/20 (from the past 24 hour(s))  Urinalysis, Complete w Microscopic     Status: Abnormal   Collection Time: 12/12/20  4:30 PM  Result Value Ref Range   Color, Urine BROWN (A) YELLOW   APPearance CLOUDY (A) CLEAR   Specific Gravity, Urine 1.027 1.005 - 1.030   pH  5.0 - 8.0    TEST NOT REPORTED DUE TO COLOR INTERFERENCE OF URINE PIGMENT   Glucose, UA (A) NEGATIVE mg/dL    TEST NOT REPORTED DUE TO COLOR INTERFERENCE OF URINE PIGMENT   Hgb urine dipstick (A) NEGATIVE    TEST NOT REPORTED DUE TO COLOR INTERFERENCE OF URINE PIGMENT   Bilirubin Urine (A) NEGATIVE    TEST NOT REPORTED DUE TO COLOR INTERFERENCE OF URINE PIGMENT   Ketones, ur (A) NEGATIVE mg/dL    TEST NOT REPORTED DUE TO COLOR INTERFERENCE OF URINE PIGMENT   Protein, ur (A) NEGATIVE mg/dL    TEST NOT REPORTED DUE TO COLOR INTERFERENCE OF URINE PIGMENT   Nitrite (A) NEGATIVE    TEST NOT REPORTED DUE TO COLOR INTERFERENCE OF URINE PIGMENT   Leukocytes,Ua (A) NEGATIVE    TEST NOT REPORTED DUE TO COLOR INTERFERENCE OF URINE PIGMENT   RBC / HPF >50 (H) 0 - 5 RBC/hpf   WBC, UA >50 (H) 0 - 5 WBC/hpf   Bacteria, UA RARE (A) NONE SEEN   Squamous Epithelial / LPF NONE SEEN 0 - 5   Mucus PRESENT   Basic metabolic panel     Status: Abnormal   Collection Time: 12/12/20  4:30 PM  Result Value Ref Range   Sodium 139 135 - 145 mmol/L   Potassium 3.6 3.5 - 5.1 mmol/L   Chloride 106 98 - 111 mmol/L   CO2 27 22 - 32 mmol/L   Glucose, Bld 111 (H) 70 - 99 mg/dL    BUN 22 (H) 6 - 20 mg/dL   Creatinine, Ser 2.77 0.61 - 1.24 mg/dL   Calcium 9.3 8.9 - 82.4 mg/dL   GFR, Estimated >23 >53 mL/min   Anion gap 6 5 - 15  CBC     Status: Abnormal   Collection Time: 12/12/20  4:30 PM  Result Value Ref Range   WBC 10.8 (H) 4.0 - 10.5 K/uL   RBC 4.78 4.22 - 5.81 MIL/uL   Hemoglobin 14.2 13.0 - 17.0 g/dL   HCT 61.4 43.1 - 54.0 %   MCV 84.7 80.0 - 100.0 fL   MCH 29.7 26.0 - 34.0 pg   MCHC 35.1 30.0 - 36.0 g/dL  RDW 12.2 11.5 - 15.5 %   Platelets 195 150 - 400 K/uL   nRBC 0.0 0.0 - 0.2 %   ____________________________________________ ____________________________________________  RADIOLOGY  I personally reviewed all radiographic images ordered to evaluate for the above acute complaints and reviewed radiology reports and findings.  These findings were personally discussed with the patient.  Please see medical record for radiology report.  ____________________________________________   PROCEDURES  Procedure(s) performed:  Procedures    Critical Care performed: no ____________________________________________   INITIAL IMPRESSION / ASSESSMENT AND PLAN / ED COURSE  Pertinent labs & imaging results that were available during my care of the patient were reviewed by me and considered in my medical decision making (see chart for details).   DDX: Stone, spasm, stent displacement, Pilo, cystitis  Apostolos Blagg. is a 45 y.o. who presents to the ED with sensation as described above.  Patient afebrile hemodynamically stable in no acute distress.  Molik cytosis likely postprocedural is not febrile exam is otherwise reassuring symptoms more consistent with ureteral spasm with stent in place.   mild hydro on the left the ultrasound ordered at triage.  KUB shows stent in appropriate position.  Does not appear consistent with infected urine. Already on abx prophylaxis.  Have ordered Ditropan XL here.  Presentation more consistent with spasms.  Will have patient  follow-up in urology on Monday for evaluation of possible stent removal.  Patient agreeable to plan     The patient was evaluated in Emergency Department today for the symptoms described in the history of present illness. He/she was evaluated in the context of the global COVID-19 pandemic, which necessitated consideration that the patient might be at risk for infection with the SARS-CoV-2 virus that causes COVID-19. Institutional protocols and algorithms that pertain to the evaluation of patients at risk for COVID-19 are in a state of rapid change based on information released by regulatory bodies including the CDC and federal and state organizations. These policies and algorithms were followed during the patient's care in the ED.  As part of my medical decision making, I reviewed the following data within the electronic MEDICAL RECORD NUMBER Nursing notes reviewed and incorporated, Labs reviewed, notes from prior ED visits and [place] Controlled Substance Database   ____________________________________________   FINAL CLINICAL IMPRESSION(S) / ED DIAGNOSES  Final diagnoses:  Dysuria      NEW MEDICATIONS STARTED DURING THIS VISIT:  Current Discharge Medication List       Note:  This document was prepared using Dragon voice recognition software and may include unintentional dictation errors.    Willy Eddy, MD 12/12/20 [phone]

## 2020-12-12 NOTE — Anesthesia Postprocedure Evaluation (Signed)
Anesthesia Post Note  Patient: Curtis Brown.  Procedure(s) Performed: CYSTOSCOPY/URETEROSCOPY/HOLMIUM LASER/STENT EXCHANGE (Left)  Patient location during evaluation: PACU Anesthesia Type: General Level of consciousness: awake and alert Pain management: pain level controlled Vital Signs Assessment: post-procedure vital signs reviewed and stable Respiratory status: spontaneous breathing, nonlabored ventilation, respiratory function stable and patient connected to nasal cannula oxygen Cardiovascular status: blood pressure returned to baseline and stable Postop Assessment: no apparent nausea or vomiting Anesthetic complications: no   No notable events documented.   Last Vitals:  Vitals:   12/11/20 1645 12/11/20 1700  BP: 124/89 (!) 144/93  Pulse: (!) 58 (!) 57  Resp: 12 18  Temp: (!) 36.1 C (!) 36.2 C  SpO2: 100% 99%    Last Pain:  Vitals:   12/11/20 1700  TempSrc: Temporal  PainSc: 2                  Lenard Simmer

## 2020-12-12 NOTE — ED Triage Notes (Signed)
Pt states that he was seen here Friday for kidney stones and had a stent placed- pt states he was told to remove the stent on Tuesday but started having blood in his urine and increased pain in his kidney- pt was told to come here for pain control as his home medications have not helped

## 2020-12-14 ENCOUNTER — Encounter: Payer: Self-pay | Admitting: Urology

## 2020-12-14 ENCOUNTER — Other Ambulatory Visit: Payer: Self-pay

## 2020-12-14 DIAGNOSIS — Z87442 Personal history of urinary calculi: Secondary | ICD-10-CM

## 2021-01-18 ENCOUNTER — Other Ambulatory Visit: Payer: Self-pay

## 2021-01-18 ENCOUNTER — Ambulatory Visit
Admission: RE | Admit: 2021-01-18 | Discharge: 2021-01-18 | Disposition: A | Discharge status: Home / Self Care | Payer: No Typology Code available for payment source | Source: Ambulatory Visit | Attending: Urology | Admitting: Urology

## 2021-01-18 DIAGNOSIS — Z87442 Personal history of urinary calculi: Secondary | ICD-10-CM | POA: Diagnosis present

## 2021-01-18 IMAGING — US US RENAL
1 series · 14 of 25 positions shown · non-contrast
Comparison: [DATE] and CT [DATE]

CLINICAL DATA: Hydronephrosis. History of left ureter stent
placement.

EXAM:
RENAL / URINARY TRACT ULTRASOUND COMPLETE

[Series 1: us renal · 0.28mm/px · 14 of 31 slices shown]
[im 1/31]
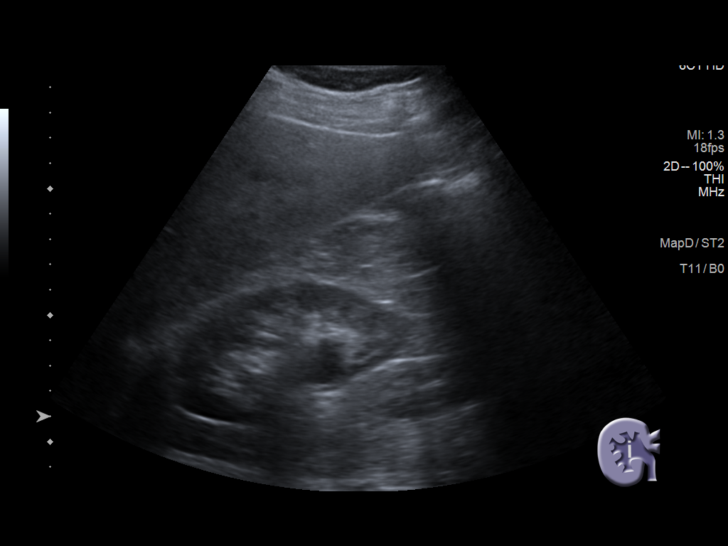
[im 3/31]
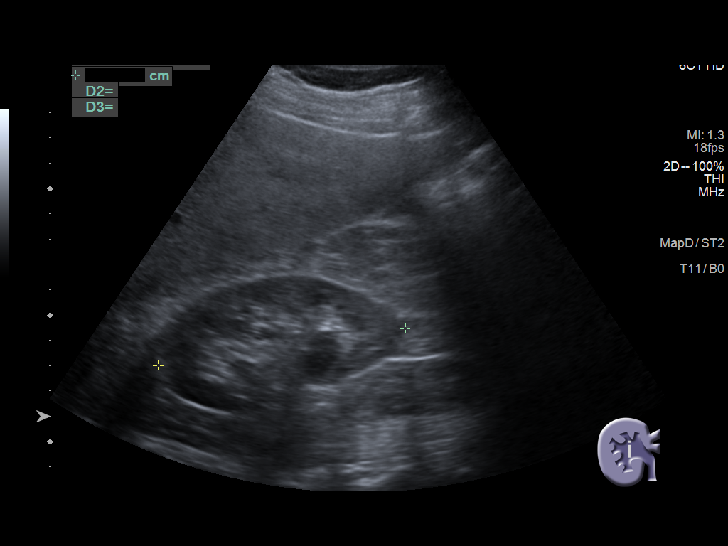
[im 6/31]
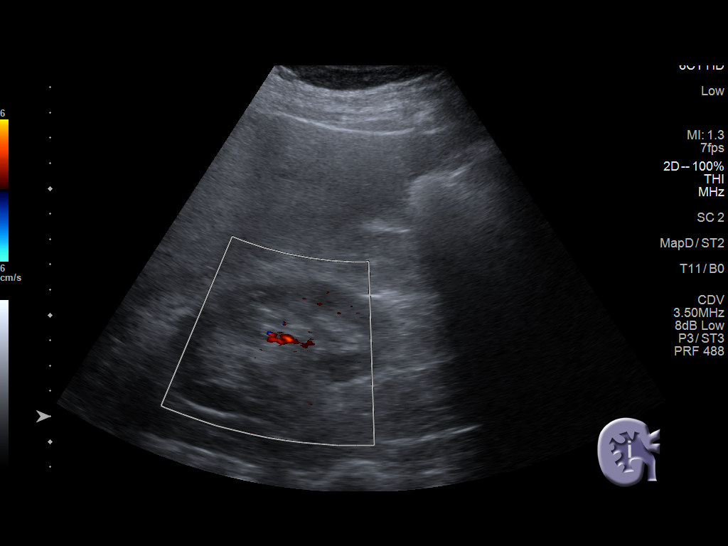
[im 8/31]
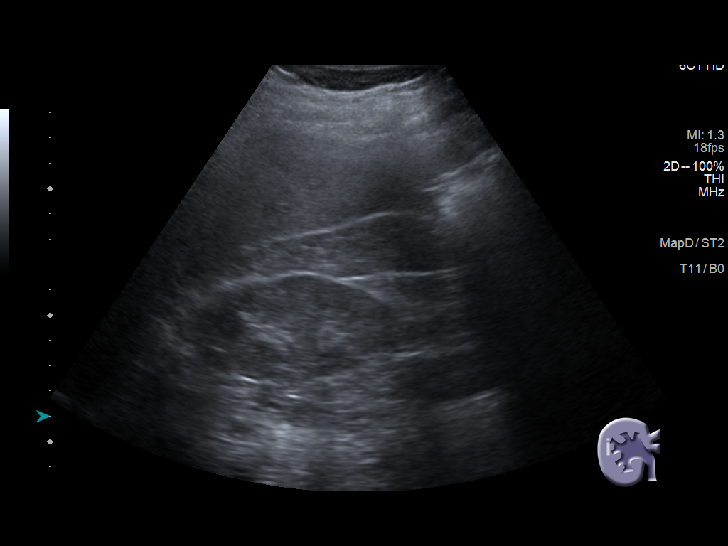
[im 11/31]
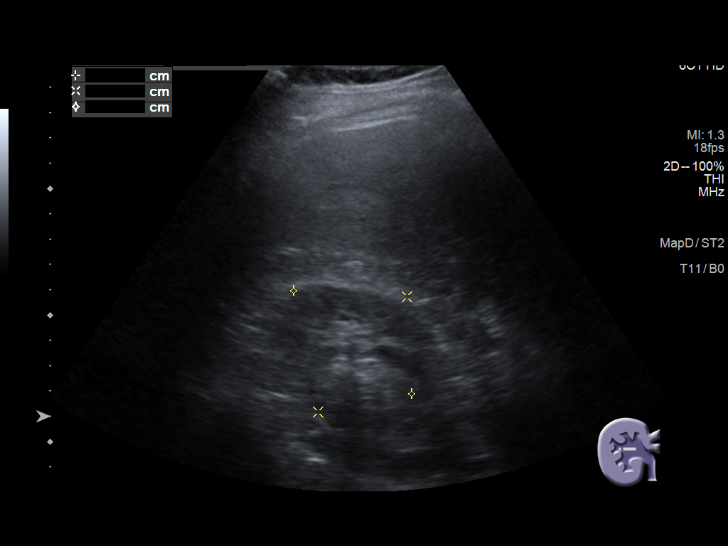
[im 12/31]
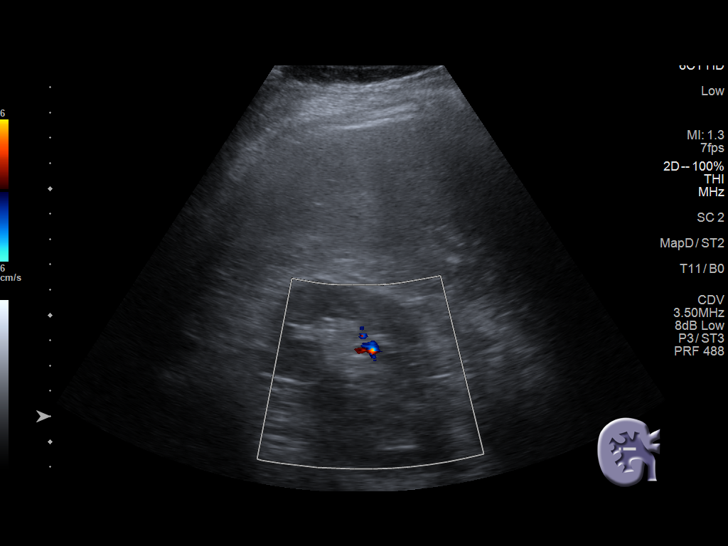
[im 14/31]
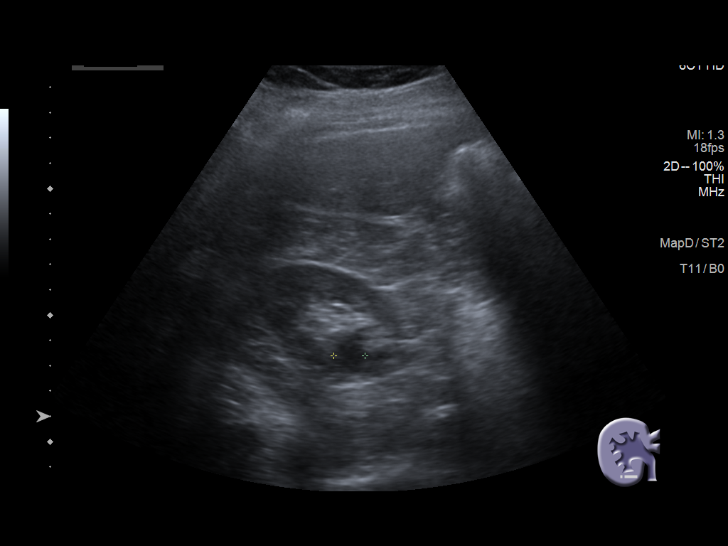
[im 17/31]
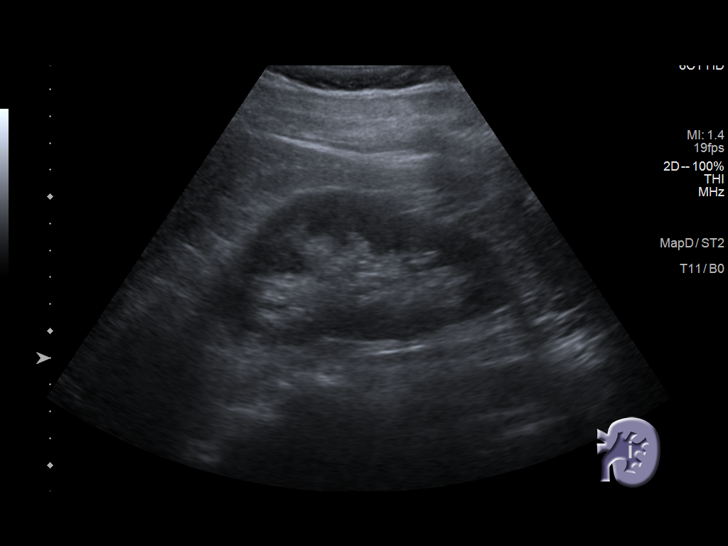
[im 19/31]
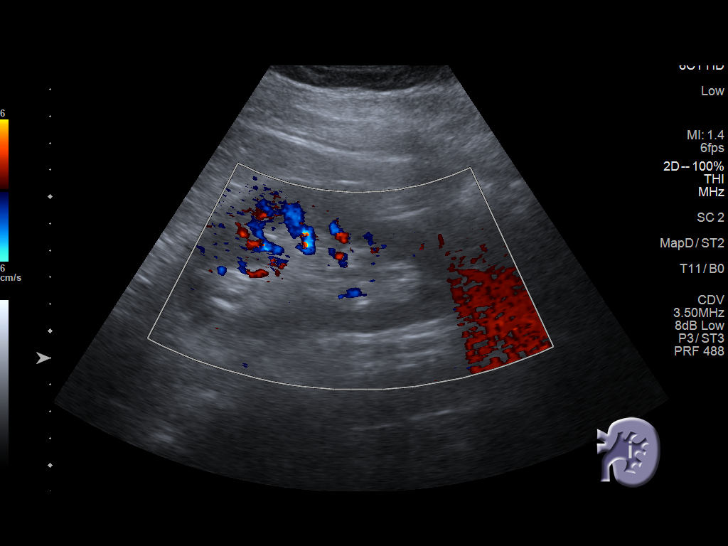
[im 21/31]
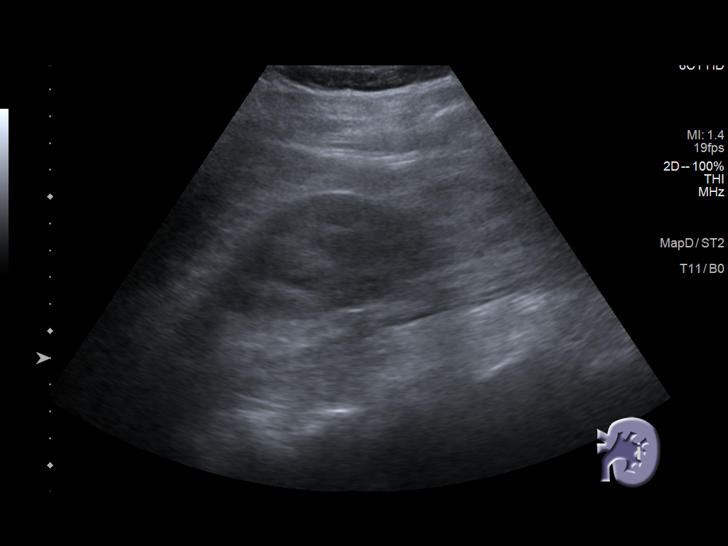
[im 23/31]
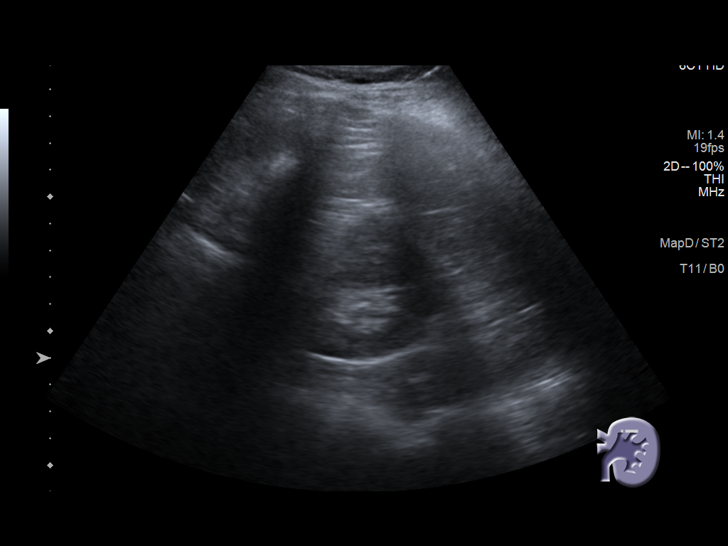
[im 26/31]
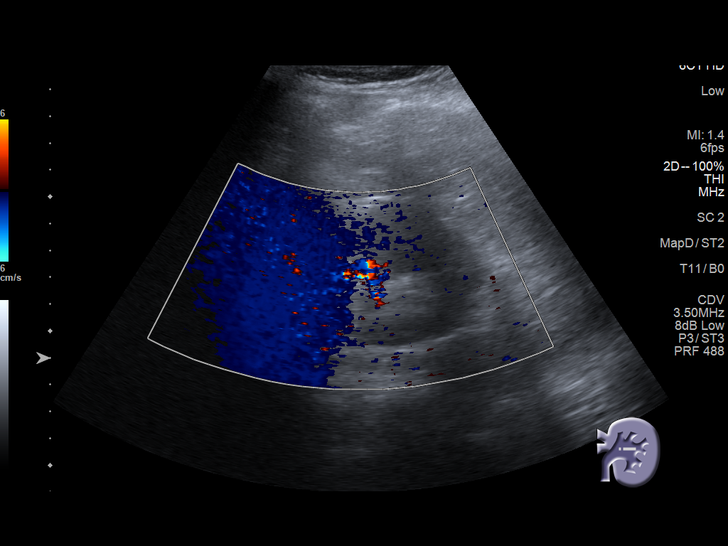
[im 28/31]
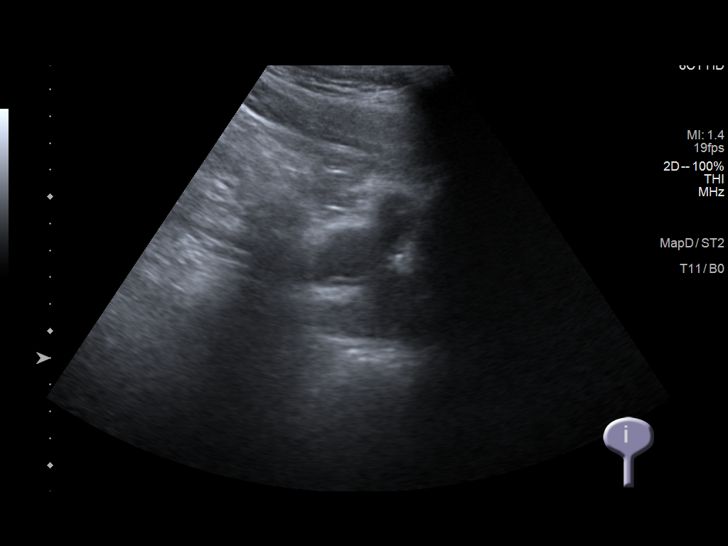
[im 31/31]
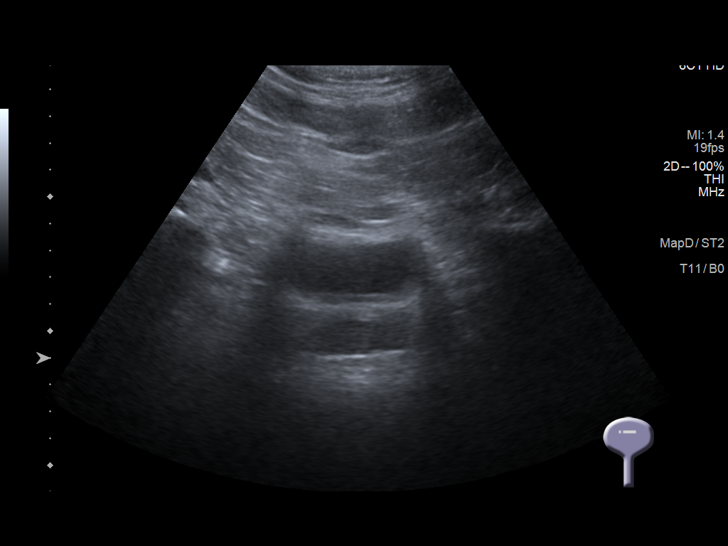

[14 of 25 positions shown; findings below may reference images not displayed]

FINDINGS: Right Kidney:

Renal measurements: 9.9 x 5.8 x 6.2 cm = volume: 184 mL. Normal
echogenicity in the right kidney. No hydronephrosis. There is a
hypoechoic structure near the lower pole that measures up to 1.8 cm
and most compatible with a cyst.

Left Kidney:

Renal measurements: 10.9 x 5.3 x 5.2 cm = volume: 155 mL.
Echogenicity within normal limits. No mass or hydronephrosis
visualized. Hydronephrosis has resolved.

Bladder:

Bladder is decompressed.

Other:

None.
IMPRESSION: 1. Left hydronephrosis has resolved.
2. Right renal cyst.

## 2021-01-21 ENCOUNTER — Encounter: Payer: Self-pay | Admitting: Urology

## 2021-01-21 ENCOUNTER — Ambulatory Visit (INDEPENDENT_AMBULATORY_CARE_PROVIDER_SITE_OTHER): Payer: No Typology Code available for payment source | Admitting: Urology

## 2021-01-21 ENCOUNTER — Other Ambulatory Visit: Payer: Self-pay

## 2021-01-21 VITALS — BP 170/107 | HR 70 | Ht 70.0 in | Wt 195.0 lb

## 2021-01-21 DIAGNOSIS — N200 Calculus of kidney: Secondary | ICD-10-CM

## 2021-01-21 NOTE — Patient Instructions (Signed)
Dietary Guidelines to Help Prevent Kidney Stones Kidney stones are deposits of minerals and salts that form inside your kidneys. Your risk of developing kidney stones may be greater depending on your diet, your lifestyle, the medicines you take, and whether you have certain medical conditions. Most people can lower their chances of developing kidney stones by following the instructions below. Your dietitian may give you more specific instructions depending on your overall health and the type of kidney stones you tend to develop. What are tips for following this plan? Reading food labels  Choose foods with "no salt added" or "low-salt" labels. Limit your salt (sodium) intake to less than 1,500 mg a day. Choose foods with calcium for each meal and snack. Try to eat about 300 mg of calcium at each meal. Foods that contain 200-500 mg of calcium a serving include: 8 oz (237 mL) of milk, calcium-fortifiednon-dairy milk, and calcium-fortifiedfruit juice. Calcium-fortified means that calcium has been added to these drinks. 8 oz (237 mL) of kefir, yogurt, and soy yogurt. 4 oz (114 g) of tofu. 1 oz (28 g) of cheese. 1 cup (150 g) of dried figs. 1 cup (91 g) of cooked broccoli. One 3 oz (85 g) can of sardines or mackerel. Most people need 1,000-1,500 mg of calcium a day. Talk to your dietitian about how much calcium is recommended for you. Shopping Buy plenty of fresh fruits and vegetables. Most people do not need to avoid fruits and vegetables, even if these foods contain nutrients that may contribute to kidney stones. When shopping for convenience foods, choose: Whole pieces of fruit. Pre-made salads with dressing on the side. Low-fat fruit and yogurt smoothies. Avoid buying frozen meals or prepared deli foods. These can be high in sodium. Look for foods with live cultures, such as yogurt and kefir. Choose high-fiber grains, such as whole-wheat breads, oat bran, and wheat cereals. Cooking Do not add  salt to food when cooking. Place a salt shaker on the table and allow each person to add his or her own salt to taste. Use vegetable protein, such as beans, textured vegetable protein (TVP), or tofu, instead of meat in pasta, casseroles, and soups. Meal planning Eat less salt, if told by your dietitian. To do this: Avoid eating processed or pre-made food. Avoid eating fast food. Eat less animal protein, including cheese, meat, poultry, or fish, if told by your dietitian. To do this: Limit the number of times you have meat, poultry, fish, or cheese each week. Eat a diet free of meat at least 2 days a week. Eat only one serving each day of meat, poultry, fish, or seafood. When you prepare animal protein, cut pieces into small portion sizes. For most meat and fish, one serving is about the size of the palm of your hand. Eat at least five servings of fresh fruits and vegetables each day. To do this: Keep fruits and vegetables on hand for snacks. Eat one piece of fruit or a handful of berries with breakfast. Have a salad and fruit at lunch. Have two kinds of vegetables at dinner. Limit foods that are high in a substance called oxalate. These include: Spinach (cooked), rhubarb, beets, sweet potatoes, and Swiss chard. Peanuts. Potato chips, french fries, and baked potatoes with skin on. Nuts and nut products. Chocolate. If you regularly take a diuretic medicine, make sure to eat at least 1 or 2 servings of fruits or vegetables that are high in potassium each day. These include: Avocado. Banana. Orange, prune,   carrot, or tomato juice. Baked potato. Cabbage. Beans and split peas. Lifestyle  Drink enough fluid to keep your urine pale yellow. This is the most important thing you can do. Spread your fluid intake throughout the day. If you drink alcohol: Limit how much you use to: 0-1 drink a day for women who are not pregnant. 0-2 drinks a day for men. Be aware of how much alcohol is in your  drink. In the [place] one drink equals one 12 oz bottle of beer (355 mL), one 5 oz glass of wine (148 mL), or one 1 oz glass of hard liquor (44 mL). Lose weight if told by your health care provider. Work with your dietitian to find an eating plan and weight loss strategies that work best for you. General information Talk to your health care provider and dietitian about taking daily supplements. You may be told the following depending on your health and the cause of your kidney stones: Not to take supplements with vitamin C. To take a calcium supplement. To take a daily probiotic supplement. To take other supplements such as magnesium, fish oil, or vitamin B6. Take over-the-counter and prescription medicines only as told by your health care provider. These include supplements. What foods should I limit? Limit your intake of the following foods, or eat them as told by your dietitian. Vegetables Spinach. Rhubarb. Beets. Canned vegetables. Pickles. Olives. Baked potatoes with skin. Grains Wheat bran. Baked goods. Salted crackers. Cereals high in sugar. Meats and other proteins Nuts. Nut butters. Large portions of meat, poultry, or fish. Salted, precooked, or cured meats, such as sausages, meat loaves, and hot dogs. Dairy Cheese. Beverages Regular soft drinks. Regular vegetable juice. Seasonings and condiments Seasoning blends with salt. Salad dressings. Soy sauce. Ketchup. Barbecue sauce. Other foods Canned soups. Canned pasta sauce. Casseroles. Pizza. Lasagna. Frozen meals. Potato chips. French fries. The items listed above may not be a complete list of foods and beverages you should limit. Contact a dietitian for more information. What foods should I avoid? Talk to your dietitian about specific foods you should avoid based on the type of kidney stones you have and your overall health. Fruits Grapefruit. The item listed above may not be a complete list of foods and beverages you should  avoid. Contact a dietitian for more information. Summary Kidney stones are deposits of minerals and salts that form inside your kidneys. You can lower your risk of kidney stones by making changes to your diet. The most important thing you can do is drink enough fluid. Drink enough fluid to keep your urine pale yellow. Talk to your dietitian about how much calcium you should have each day, and eat less salt and animal protein as told by your dietitian. This information is not intended to replace advice given to you by your health care provider. Make sure you discuss any questions you have with your health care provider. Document Revised: 02/14/2019 Document Reviewed: 02/14/2019 Elsevier Patient Education  2022 [employer] Inc.  

## 2021-01-21 NOTE — Progress Notes (Signed)
   01/21/2021 8:35 AM   Ilean Skill. [birth year] [id number]  Reason for visit: Follow up left ureteral stone  HPI: 45 year old male who originally presented in September 2022 with poorly controlled renal colic secondary to a 5 mm left proximal ureteral stone and underwent stent placement at that time with Dr. Annabell Howells.  He was unable to access the stone secondary to a tight left ureter.  He ultimately underwent follow-up ureteroscopy with me in October 2022, the ureter remained very tight.  His left proximal ureteral stone and left renal stones were dusted, and there were no right-sided stones on CT.  He removed his stent at home and has been doing well since that time and denies any complaints of flank pain or hematuria.  I personally reviewed and interpreted the renal ultrasound dated 01/18/2021 that shows no evidence of hydronephrosis or stones.  We discussed general stone prevention strategies including adequate hydration with goal of producing 2.5 L of urine daily, increasing citric acid intake, increasing calcium intake during high oxalate meals, minimizing animal protein, and decreasing salt intake. Information about dietary recommendations given today.   I also offered him 24-hour urine work-up for his recurrent stone disease, as well as yearly surveillance with [facility], but he opts to follow-up as needed.  I recommended considering shockwave lithotripsy in the future with his narrow ureters, and poor tolerance of ureteral stents  Sondra Come, MD  [facility] [address] [place], [place] [address] [phone]

## 2021-07-08 ENCOUNTER — Emergency Department (HOSPITAL_COMMUNITY): Payer: PRIVATE HEALTH INSURANCE

## 2021-07-08 ENCOUNTER — Encounter (HOSPITAL_COMMUNITY): Payer: Self-pay

## 2021-07-08 ENCOUNTER — Other Ambulatory Visit: Payer: Self-pay

## 2021-07-08 ENCOUNTER — Inpatient Hospital Stay (HOSPITAL_COMMUNITY)
Admission: EM | Admit: 2021-07-08 | Discharge: 2021-07-10 | DRG: 063 | Disposition: A | Discharge status: Home / Self Care | Payer: PRIVATE HEALTH INSURANCE | Attending: Internal Medicine | Admitting: Internal Medicine

## 2021-07-08 DIAGNOSIS — E785 Hyperlipidemia, unspecified: Secondary | ICD-10-CM | POA: Diagnosis present

## 2021-07-08 DIAGNOSIS — M4802 Spinal stenosis, cervical region: Secondary | ICD-10-CM | POA: Diagnosis present

## 2021-07-08 DIAGNOSIS — R0789 Other chest pain: Secondary | ICD-10-CM | POA: Diagnosis present

## 2021-07-08 DIAGNOSIS — I10 Essential (primary) hypertension: Secondary | ICD-10-CM | POA: Diagnosis present

## 2021-07-08 DIAGNOSIS — R531 Weakness: Secondary | ICD-10-CM | POA: Diagnosis present

## 2021-07-08 DIAGNOSIS — I2510 Atherosclerotic heart disease of native coronary artery without angina pectoris: Secondary | ICD-10-CM | POA: Diagnosis present

## 2021-07-08 DIAGNOSIS — F449 Dissociative and conversion disorder, unspecified: Secondary | ICD-10-CM | POA: Diagnosis present

## 2021-07-08 DIAGNOSIS — I639 Cerebral infarction, unspecified: Secondary | ICD-10-CM | POA: Diagnosis not present

## 2021-07-08 DIAGNOSIS — Z20822 Contact with and (suspected) exposure to covid-19: Secondary | ICD-10-CM | POA: Diagnosis present

## 2021-07-08 DIAGNOSIS — Z885 Allergy status to narcotic agent status: Secondary | ICD-10-CM

## 2021-07-08 DIAGNOSIS — Z6831 Body mass index (BMI) 31.0-31.9, adult: Secondary | ICD-10-CM | POA: Diagnosis not present

## 2021-07-08 DIAGNOSIS — R200 Anesthesia of skin: Secondary | ICD-10-CM | POA: Diagnosis present

## 2021-07-08 DIAGNOSIS — Z87442 Personal history of urinary calculi: Secondary | ICD-10-CM | POA: Diagnosis not present

## 2021-07-08 DIAGNOSIS — G459 Transient cerebral ischemic attack, unspecified: Secondary | ICD-10-CM | POA: Diagnosis present

## 2021-07-08 DIAGNOSIS — G4733 Obstructive sleep apnea (adult) (pediatric): Secondary | ICD-10-CM | POA: Diagnosis present

## 2021-07-08 DIAGNOSIS — E669 Obesity, unspecified: Secondary | ICD-10-CM | POA: Diagnosis present

## 2021-07-08 DIAGNOSIS — I6389 Other cerebral infarction: Secondary | ICD-10-CM | POA: Diagnosis not present

## 2021-07-08 LAB — DIFFERENTIAL
Abs Immature Granulocytes: 0.01 10*3/uL (ref 0.00–0.07)
Basophils Absolute: 0.1 10*3/uL (ref 0.0–0.1)
Basophils Relative: 1 %
Eosinophils Absolute: 0.3 10*3/uL (ref 0.0–0.5)
Eosinophils Relative: 5 %
Immature Granulocytes: 0 %
Lymphocytes Relative: 29 %
Lymphs Abs: 1.7 10*3/uL (ref 0.7–4.0)
Monocytes Absolute: 0.5 10*3/uL (ref 0.1–1.0)
Monocytes Relative: 9 %
Neutro Abs: 3.4 10*3/uL (ref 1.7–7.7)
Neutrophils Relative %: 56 %

## 2021-07-08 LAB — CBC
HCT: 45.8 % (ref 39.0–52.0)
Hemoglobin: 15.9 g/dL (ref 13.0–17.0)
MCH: 29.8 pg (ref 26.0–34.0)
MCHC: 34.7 g/dL (ref 30.0–36.0)
MCV: 85.8 fL (ref 80.0–100.0)
Platelets: 213 10*3/uL (ref 150–400)
RBC: 5.34 MIL/uL (ref 4.22–5.81)
RDW: 12.4 % (ref 11.5–15.5)
WBC: 6 10*3/uL (ref 4.0–10.5)
nRBC: 0 % (ref 0.0–0.2)

## 2021-07-08 LAB — URINALYSIS, ROUTINE W REFLEX MICROSCOPIC
Bacteria, UA: NONE SEEN
Bilirubin Urine: NEGATIVE
Glucose, UA: NEGATIVE mg/dL
Ketones, ur: NEGATIVE mg/dL
Leukocytes,Ua: NEGATIVE
Nitrite: NEGATIVE
Protein, ur: NEGATIVE mg/dL
Specific Gravity, Urine: 1.029 (ref 1.005–1.030)
pH: 6 (ref 5.0–8.0)

## 2021-07-08 LAB — I-STAT CHEM 8, ED
BUN: 26 mg/dL — ABNORMAL HIGH (ref 6–20)
Calcium, Ion: 1.19 mmol/L (ref 1.15–1.40)
Chloride: 101 mmol/L (ref 98–111)
Creatinine, Ser: 0.9 mg/dL (ref 0.61–1.24)
Glucose, Bld: 94 mg/dL (ref 70–99)
HCT: 47 % (ref 39.0–52.0)
Hemoglobin: 16 g/dL (ref 13.0–17.0)
Potassium: 3.8 mmol/L (ref 3.5–5.1)
Sodium: 139 mmol/L (ref 135–145)
TCO2: 26 mmol/L (ref 22–32)

## 2021-07-08 LAB — COMPREHENSIVE METABOLIC PANEL
ALT: 23 U/L (ref 0–44)
AST: 21 U/L (ref 15–41)
Albumin: 4.4 g/dL (ref 3.5–5.0)
Alkaline Phosphatase: 62 U/L (ref 38–126)
Anion gap: 8 (ref 5–15)
BUN: 24 mg/dL — ABNORMAL HIGH (ref 6–20)
CO2: 25 mmol/L (ref 22–32)
Calcium: 9.4 mg/dL (ref 8.9–10.3)
Chloride: 104 mmol/L (ref 98–111)
Creatinine, Ser: 0.92 mg/dL (ref 0.61–1.24)
GFR, Estimated: >60 mL/min (ref >60)
Glucose, Bld: 101 mg/dL — ABNORMAL HIGH (ref 70–99)
Potassium: 3.9 mmol/L (ref 3.5–5.1)
Sodium: 137 mmol/L (ref 135–145)
Total Bilirubin: 0.9 mg/dL (ref 0.3–1.2)
Total Protein: 7.4 g/dL (ref 6.5–8.1)

## 2021-07-08 LAB — RESP PANEL BY RT-PCR (FLU A&B, COVID) ARPGX2
Influenza A by PCR: NEGATIVE
Influenza B by PCR: NEGATIVE
SARS Coronavirus 2 by RT PCR: NEGATIVE

## 2021-07-08 LAB — PROTIME-INR
INR: 1 (ref 0.8–1.2)
Prothrombin Time: 12.5 seconds (ref 11.4–15.2)

## 2021-07-08 LAB — HEMOGLOBIN A1C
Hgb A1c MFr Bld: 5.2 % (ref 4.8–5.6)
Mean Plasma Glucose: 102.54 mg/dL

## 2021-07-08 LAB — APTT: aPTT: 29 seconds (ref 24–36)

## 2021-07-08 LAB — ETHANOL: Alcohol, Ethyl (B): <10 mg/dL (ref <10)

## 2021-07-08 LAB — RAPID URINE DRUG SCREEN, HOSP PERFORMED
Amphetamines: NOT DETECTED
Barbiturates: NOT DETECTED
Benzodiazepines: NOT DETECTED
Cocaine: NOT DETECTED
Opiates: NOT DETECTED
Tetrahydrocannabinol: NOT DETECTED

## 2021-07-08 LAB — TROPONIN I (HIGH SENSITIVITY): Troponin I (High Sensitivity): 2 ng/L (ref <18)

## 2021-07-08 LAB — CBG MONITORING, ED: Glucose-Capillary: 79 mg/dL (ref 70–99)

## 2021-07-08 IMAGING — CT CT HEAD CODE STROKE
4 series · 16 of 47 positions shown, 18 images · non-contrast
Comparison: None Available.

CLINICAL DATA: Code stroke. Neuro deficit, acute, stroke suspected.
Left-sided weakness.



[Series 3: head wo · axial · 0.47mm/px · z∈[-128,-8]mm · 7 of 32 slices shown, 9 images]
[im 4/32  brain]
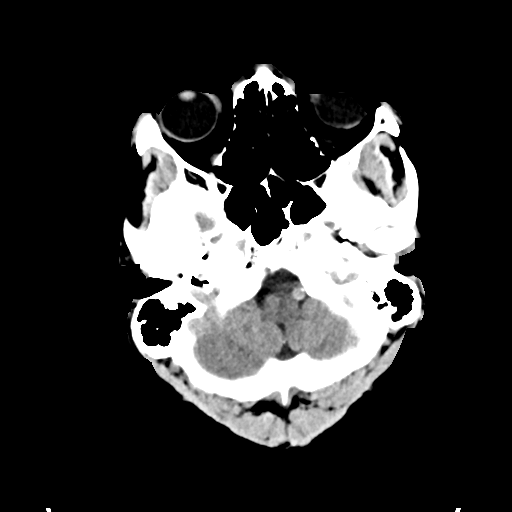
[im 4/32  bone]
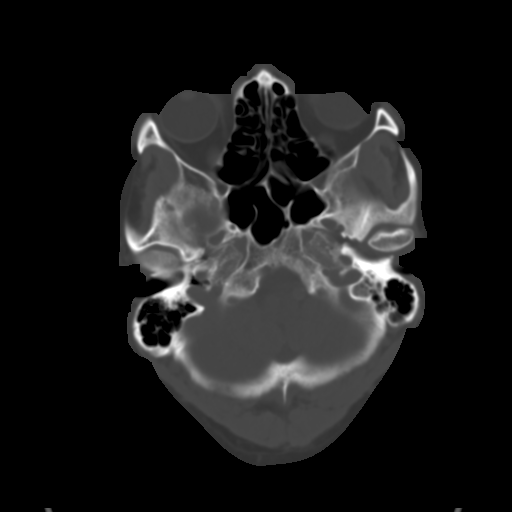
[im 8/32  brain]
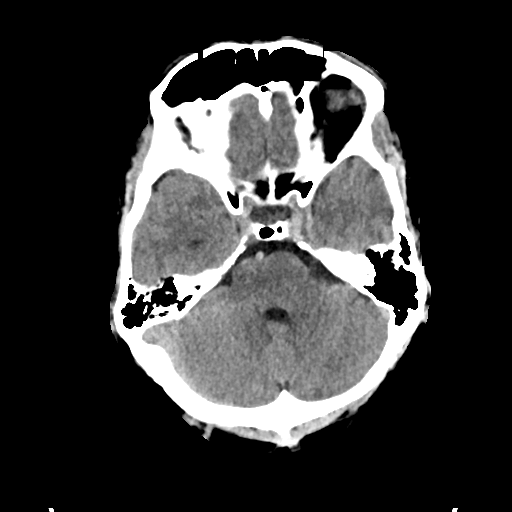
[im 12/32  brain]
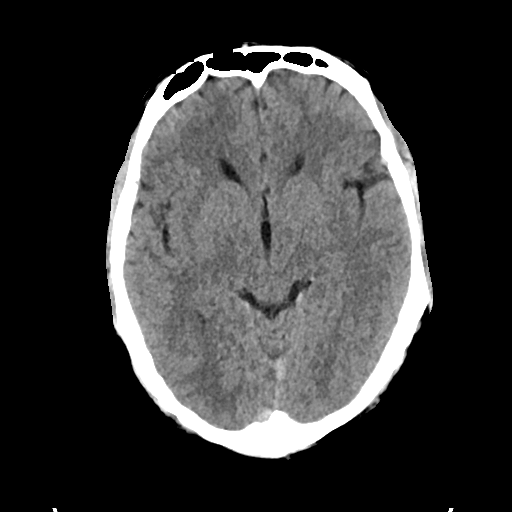
[im 16/32  brain]
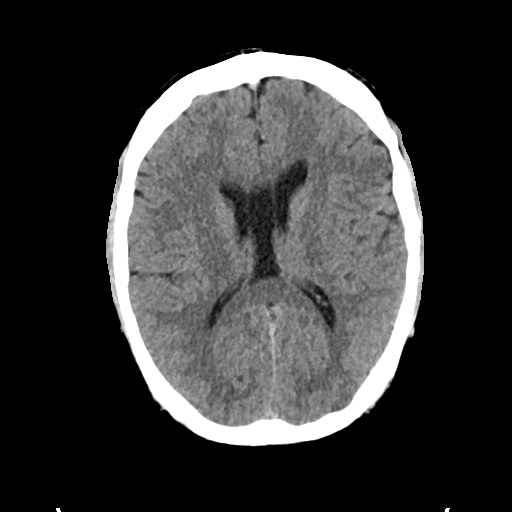
[im 20/32  brain]
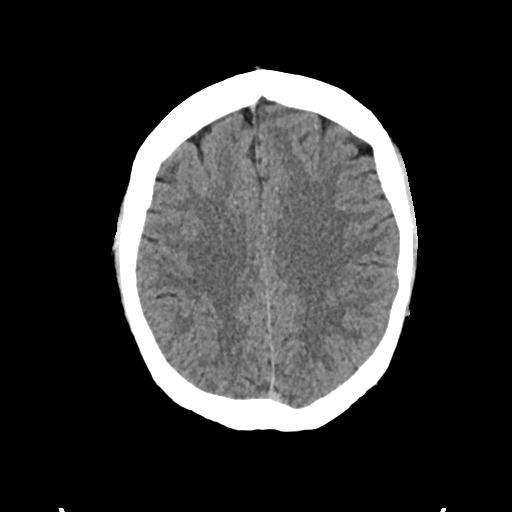
[im 20/32  bone]
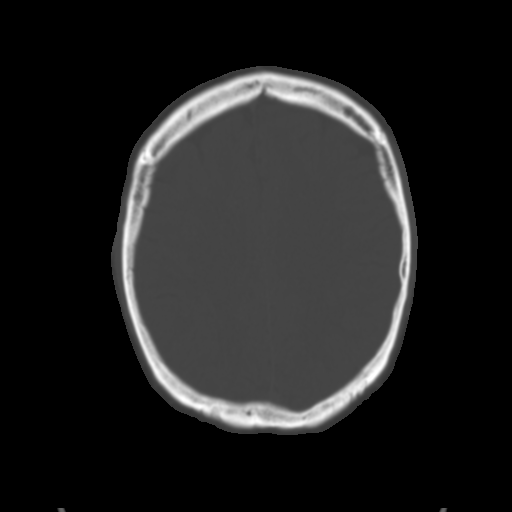
[im 24/32  brain]
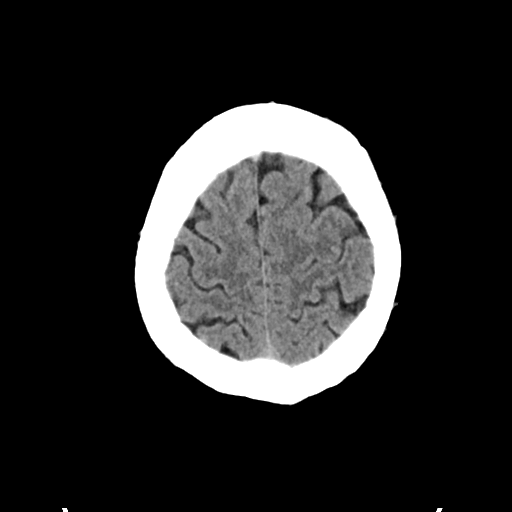
[im 28/32  brain]
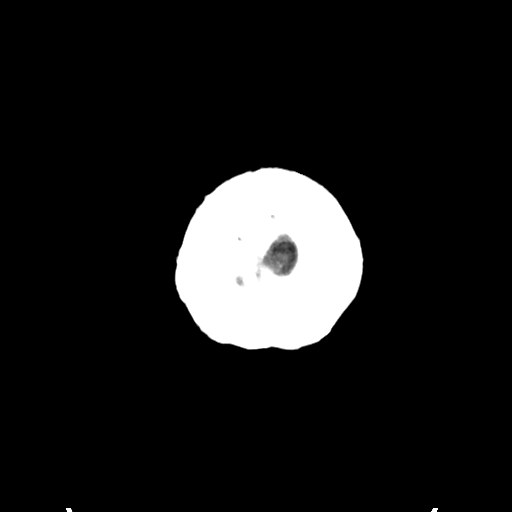

[Series 4: head bone · axial · 0.47mm/px · z∈[-129,-97]mm · 3 of 79 slices shown]
[im 8/79  bone]
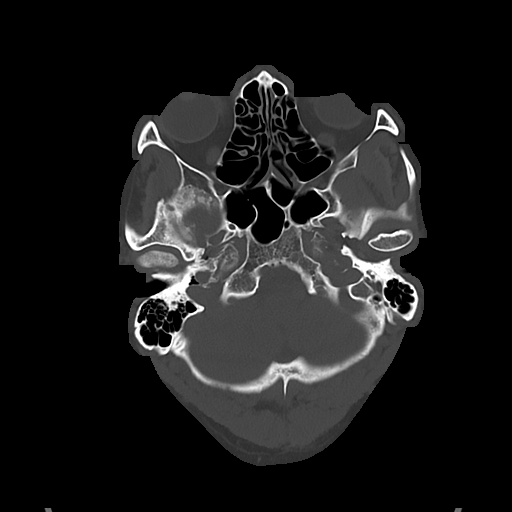
[im 16/79  bone]
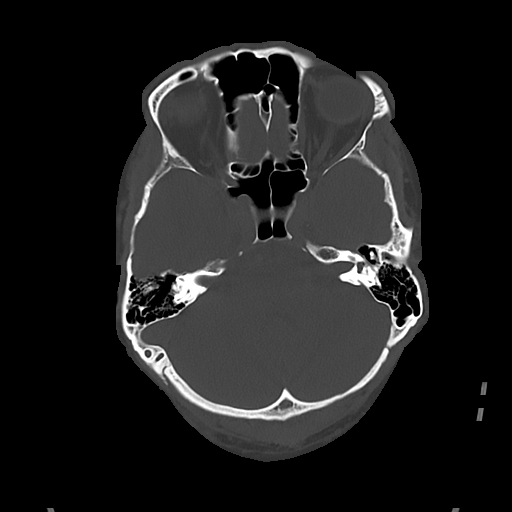
[im 24/79  bone]
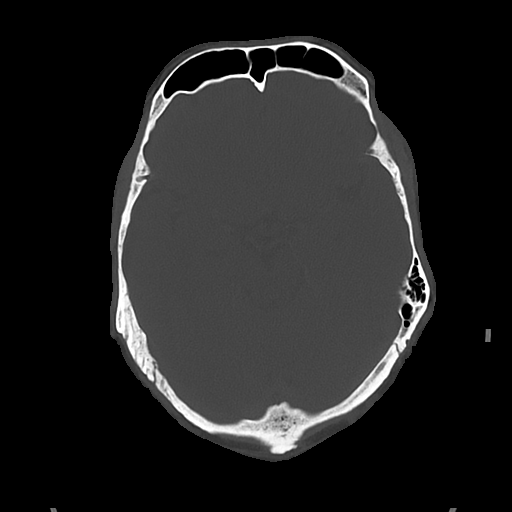

[Series 5: cor soft · coronal · 0.33mm/px · 3 of 70 slices shown]
[im 24/70  brain]
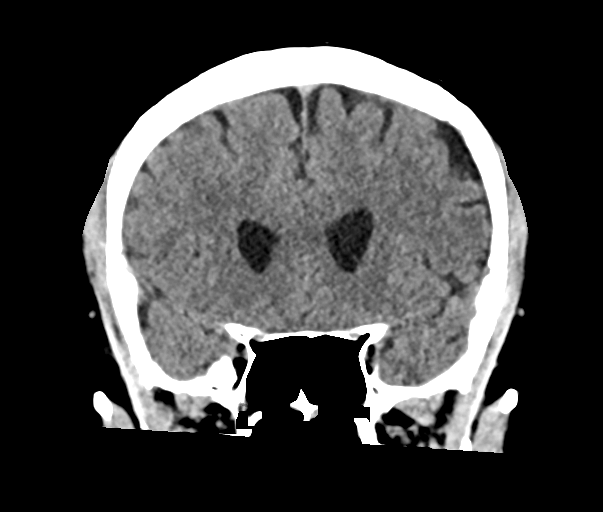
[im 31/70  brain]
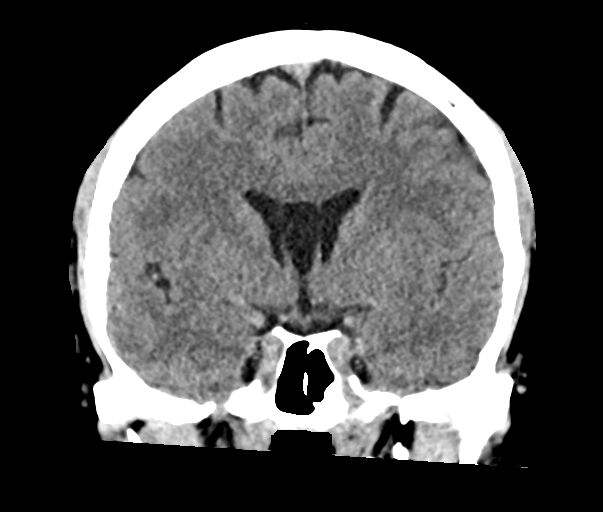
[im 39/70  brain]
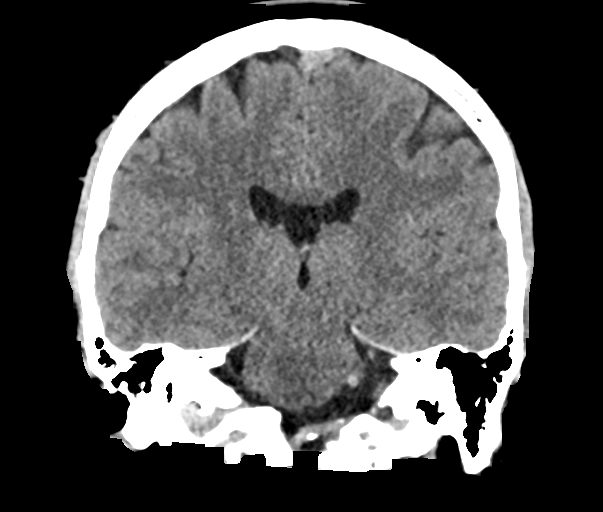

[Series 6: sag soft · sagittal · 0.33mm/px · 3 of 67 slices shown]
[im 23/67  brain]
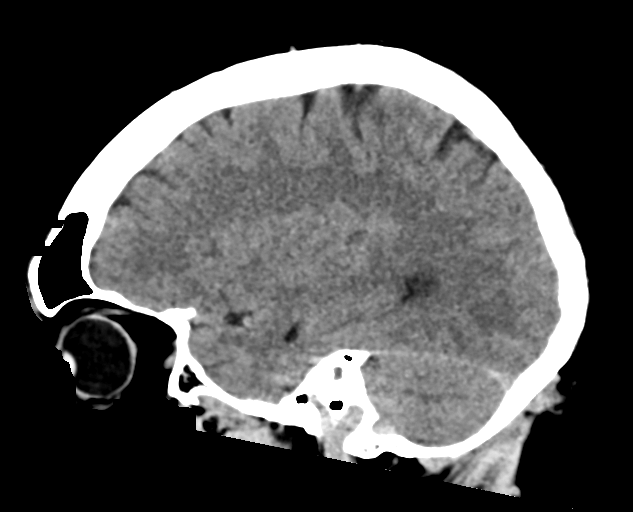
[im 34/67  brain]
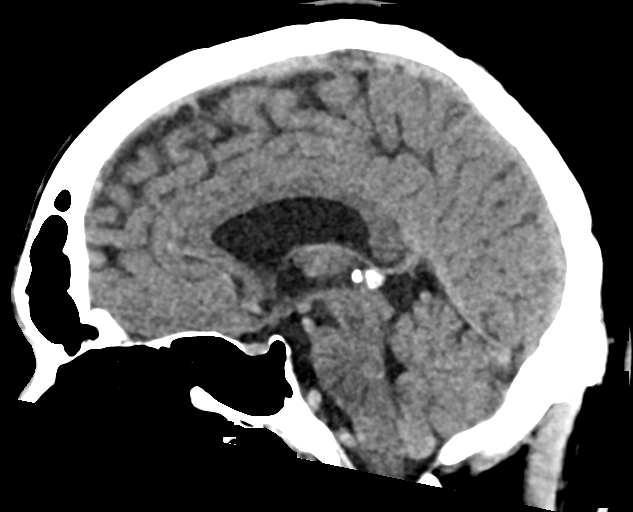
[im 45/67  brain]
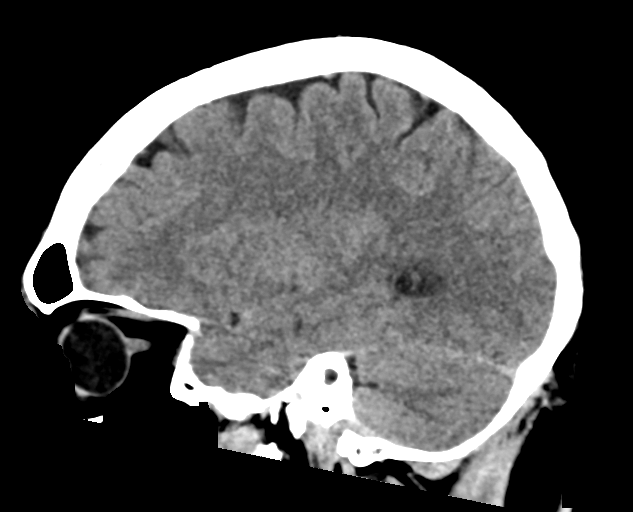

[16 of 47 positions shown; findings below may reference images not displayed]

FINDINGS: Brain: Normal appearance without evidence of old or acute
infarction, mass lesion, hemorrhage, hydrocephalus or extra-axial
collection.

Vascular: Premature vascular calcification of the major vessels at
the base of the brain, particularly the distal vertebral arteries.
One could question a hyperdense focus within the right M1 segment,
but this is not definite.

Skull: Normal

Sinuses/Orbits: Clear/normal

Other: None

ASPECTS (Alberta Stroke Program Early CT Score)

- Ganglionic level infarction (caudate, lentiform nuclei, internal
capsule, insula, M1-M3 cortex): 7

- Supraganglionic infarction (M4-M6 cortex): 3

Total score (0-10 with 10 being normal): 10
IMPRESSION: 1. Normal appearance of the brain itself. Premature vascular
calcification for a person of this age, particularly visible in the
distal vertebral arteries. One could question a hyperdense focus in
the right M1 segment but this is not definite.
2. ASPECTS is 10.
3. These results were communicated to Dr. NASSIH at [DATE] on
[DATE] by text page via the [employer] messaging system.

## 2021-07-08 MED ORDER — LABETALOL HCL 5 MG/ML IV SOLN
INTRAVENOUS | Status: AC
  Administered 2021-07-08: 10 mg via INTRAVENOUS
  Filled 2021-07-08: qty 4

## 2021-07-08 MED ORDER — LABETALOL HCL 5 MG/ML IV SOLN: 10.0000 mg | Freq: Once | INTRAVENOUS | Status: AC

## 2021-07-08 MED ORDER — STROKE: EARLY STAGES OF RECOVERY BOOK
Freq: Once | Status: AC
  Filled 2021-07-08: qty 1

## 2021-07-08 MED ORDER — IOHEXOL 350 MG/ML SOLN
100.0000 mL | Freq: Once | INTRAVENOUS | Status: AC | PRN
  Administered 2021-07-08: 100 mL via INTRAVENOUS

## 2021-07-08 MED ORDER — TENECTEPLASE FOR STROKE
0.2500 mg/kg | PACK | Freq: Once | INTRAVENOUS | Status: AC
  Administered 2021-07-08: 25 mg via INTRAVENOUS
  Filled 2021-07-08: qty 10

## 2021-07-08 MED ORDER — CLEVIDIPINE BUTYRATE 0.5 MG/ML IV EMUL: 0.0000 mg/h | INTRAVENOUS | Status: DC

## 2021-07-08 MED ORDER — PANTOPRAZOLE SODIUM 40 MG IV SOLR: 40.0000 mg | Freq: Every day | INTRAVENOUS | Status: DC

## 2021-07-08 MED ORDER — AMLODIPINE BESYLATE 5 MG PO TABS: 5.0000 mg | ORAL_TABLET | Freq: Every day | ORAL | Status: DC

## 2021-07-08 MED ORDER — SENNOSIDES-DOCUSATE SODIUM 8.6-50 MG PO TABS: 1.0000 | ORAL_TABLET | Freq: Every evening | ORAL | Status: DC | PRN

## 2021-07-08 MED ORDER — SODIUM CHLORIDE 0.9 % IV SOLN: INTRAVENOUS | Status: DC

## 2021-07-08 NOTE — ED Provider Triage Note (Signed)
Emergency Medicine Provider Triage Evaluation Note ? ?Ilean Skill. , a 46 y.o. male  was evaluated in triage.  Pt complains of sudden onset left-sided numbness and weakness.  Last known well 8 PM this evening.  Patient states that he was eating dinner when he had sudden onset of numbness in his left arm followed by heaviness and weakness in his upper and lower extremity.  He denies any vision changes, fever, chills.  He is never had anything like this before. ? ?Review of Systems  ?Positive: Weakness ?Negative: Fever ? ?Physical Exam  ?BP (!) 160/125 (BP Location: Right Arm)   Pulse 76   Temp 98.1 ?F (36.7 ?C) (Oral)   Resp 16   SpO2 100%  ?Gen:   Awake, no distress   ?Resp:  Normal effort  ?MSK:   Moves extremities without difficulty  ?Other:  Left-sided weakness ? ?Medical Decision Making  ?Medically screening exam initiated at 9:34 PM.  Appropriate orders placed.  Ilean Skill. was informed that the remainder of the evaluation will be completed by another provider, this initial triage assessment does not replace that evaluation, and the importance of remaining in the ED until their evaluation is complete. ? ?Patient with hypertension, left-sided weakness and hemiparesthesia.  Stroke work-up initiated and code stroke called ?  ?Arthor Captain, PA-C ?07/08/21 2136 ? ?

## 2021-07-08 NOTE — Progress Notes (Signed)
PHARMACIST CODE STROKE RESPONSE ? ?Notified to mix TNK at 2202 by Dr. Cheral Marker ?Delivered TNK to RN at 2207 ? ?TNK dose = 25 mg IV over 5 seconds ? ?Issues/delays encountered (if applicable): Code stroke called ~40 minutes from presentation. Diastolic blood pressure [id number] mmHg at time of verbal order for TNK, requiring labetalol adiministration. ? ?Lorelei Pont, PharmD, BCPS ?07/08/2021 10:10 PM ?ED Clinical Pharmacist -  [phone] ? ? ?

## 2021-07-08 NOTE — H&P (Addendum)
?                    NEURO HOSPITALIST HISTORY AND PHYSICAL  ? ?Requestig physician: Dr. Roslynn Amble ? ?Reason for Consult: Acute onset of left sided weakness ? ?History obtained from:  Patient and Chart    ? ?HPI:                                                                                                                                         ? Curtis Brown. is an 46 y.o. male with a PMHx of anginal pain, DDD, kidney stones, HTN and OSA who presents with acute onset of left arm and leg weakness first noticed at 2000 while eating supper. He has no prior history of MI or stroke. BP was 160/125. CBG 79. ? ?Past Medical History:  ?Diagnosis Date  ? Anginal pain ([place])   ? Complication of anesthesia   ? DDD (degenerative disc disease), lumbar   ? Family history of adverse reaction to anesthesia   ? PONV  ? History of kidney stones   ? Hypertension   ? diet controlled  ? Kidney stone   ? PONV (postoperative nausea and vomiting)   ? Sleep apnea   ? no CPAP  ? ? ?Past Surgical History:  ?Procedure Laterality Date  ? arthroscopic knee Left 2015  ? x 2 surgeries  ? CYSTOSCOPY W/ URETERAL STENT PLACEMENT Left 11/07/2020  ? Procedure: CYSTOSCOPY WITH RETROGRADE PYELOGRAM/URETERAL STENT PLACEMENT;  Surgeon: Irine Seal, MD;  Location: [facility] ORS;  Service: Urology;  Laterality: Left;  ? CYSTOSCOPY/URETEROSCOPY/HOLMIUM LASER/STENT PLACEMENT Left 12/11/2020  ? Procedure: CYSTOSCOPY/URETEROSCOPY/HOLMIUM LASER/STENT EXCHANGE;  Surgeon: Billey Co, MD;  Location: [facility] ORS;  Service: Urology;  Laterality: Left;  ? FRACTURE SURGERY    ? tibia with rod 2000  ? HERNIA REPAIR    ? INGUINAL HERNIA REPAIR    ? LEG SURGERY    ? ? ?No family history on file.         ? ?Social History:  reports that he has never smoked. He has never used smokeless tobacco. He reports current alcohol use. He reports that he does not use drugs. ? ?Allergies  ?Allergen Reactions  ? Oxycodone-Acetaminophen Hives  ?  Can take Tylenol ?  ? Percocet  [Oxycodone-Acetaminophen] Hives  ? ? ?MEDICATIONS:                                                                                                                     ?  No current facility-administered medications on file prior to encounter.  ? ?Current Outpatient Medications on File Prior to Encounter  ?Medication Sig Dispense Refill  ? acetaminophen (TYLENOL) 325 MG tablet Take 650 mg by mouth every 6 (six) hours as needed for moderate pain.    ? docusate sodium (COLACE) 100 MG capsule Take 1 tablet once or twice daily as needed for constipation while taking narcotic pain medicine 30 capsule 0  ? ibuprofen (ADVIL) 200 MG tablet Take 400 mg by mouth every 6 (six) hours as needed for moderate pain.    ? ? ?ROS:                                                                                                                                       ?As per HPI. Detailed ROS deferred due to acuity of presentation. ? ? ?Blood pressure (!) 160/125, pulse 76, temperature 98.1 ?F (36.7 ?C), temperature source Oral, resp. rate 16, SpO2 100 %. ? ? ?General Examination:                                                                                                      ? ?Physical Exam  ?HEENT-  [place]/AT   ?Lungs- Respirations unlabored ?Extremities- No edema ? ?Neurological Examination ?Mental Status: Awake and alert. Oriented x 5. Speech fluent without dysarthria. Naming intact. Able to follow all commands. No neglect.  ?Cranial Nerves: ?II: Visual fields intact bilaterally. No extinction to DSS.   ?III,IV, VI: No ptosis. EOMI. No nystagmus.  ?V: FT sensation intact bilaterally ?VII: Smile symmetric ?VIII: Hearing intact to voice ?IX,X: No hypophonia ?XI: Head is midline ?XII: Midline tongue extension ?Motor: ?RUE and RLE 5/5 ?LUE 4/5 with bobbing drift and increased latency of movement to command ?LLE 4+/5 with bobbing drift and increased latency of movement to command ?Sensory: Decreased FT to right lower extremity. LLE with  normal FT. BUE with normal FT sensation. Hyperesthesia to temperature, LUE and LLE. ?Deep Tendon Reflexes: 2+ and symmetric throughout  ?Plantars: Right: downgoing  Left: downgoing ?Cerebellar: No ataxia with FNF and H-S bilaterally.  ?Gait: Deferred ?  ?Lab Results: ?Basic Metabolic Panel: ?No results for input(s): NA, K, CL, CO2, GLUCOSE, BUN, CREATININE, CALCIUM, MG, PHOS in the last 168 hours. ? ?CBC: ?No results for input(s): WBC, NEUTROABS, HGB, HCT, MCV, PLT in the last 168 hours. ? ?Cardiac Enzymes: ?No results for input(s): CKTOTAL, CKMB, CKMBINDEX, TROPONINI in the last 168 hours. ? ?Lipid Panel: ?No results for  input(s): CHOL, TRIG, HDL, CHOLHDL, VLDL, LDLCALC in the last 168 hours. ? ?Imaging: ?No results found. ? ? ?Assessment: 47 year old male presenting with acute onset of left sided weakness and sensory numbness ?1. Exam reveals LUE and LLE weakness as well as left sided sensory deficits. NIHSS = 3.  ?2. CT head without acute abnormality. Premature vascular ?calcification for a person of this age, particularly visible in the ?distal vertebral arteries.  ?3. CTA of head and neck: No LVO ?4. CTP with no perfusion abnormality.  ?5. After comprehensive review of possible contraindications, he has no absolute contraindications to TNK administration. ?6. Patient is a TNK candidate. Discussed extensively the risks/benefits of TNK treatment vs. no treatment with the patient, including risks of hemorrhage and death with IV thrombolytic administration versus worse overall outcomes on average in patients within the thrombolysis time window who are not administered TNK. Overall benefits of TNK regarding long-term prognosis are felt to outweigh risks. The patient expressed understanding and wish to proceed with TNK. ?7. Stroke risk factors: History of anginal pain, HTN and OSA  ? ?Recommendations: ?1. Admitting to Neuro ICU.  ?2. Post-TNK order set to include frequent neuro checks and BP management.  ?3. No  antiplatelet medications or anticoagulants for at least 24 hours following TNK.  ?4. DVT prophylaxis with SCDs.  ?5. Will need to be started on a statin.  ?6. Will need to be started on antiplatelet therapy if follow up CT at 24 hours is negative for hemorrhagic conversion. ?7. Cardiac telemetry  ?8. TTE.  ?9. MRI brain  ?10. PT/OT/Speech.  ?11. NPO until passes swallow evaluation.  ?12. Fasting lipid panel, HgbA1c ?13. Starting scheduled daily Norvasc ? ?  ?Electronically signed: Dr. Kerney Elbe ?07/08/2021, 9:45 PM ? ?   ?

## 2021-07-08 NOTE — ED Notes (Signed)
Pt transported to CT by A. Harris, PA-C.  ?

## 2021-07-08 NOTE — ED Triage Notes (Addendum)
Pt reported to ED with c/o left sided weakness that started at 2000 tonight while eating dinner.  ?

## 2021-07-09 ENCOUNTER — Inpatient Hospital Stay (HOSPITAL_COMMUNITY): Payer: PRIVATE HEALTH INSURANCE

## 2021-07-09 DIAGNOSIS — R531 Weakness: Secondary | ICD-10-CM | POA: Diagnosis not present

## 2021-07-09 DIAGNOSIS — I6389 Other cerebral infarction: Secondary | ICD-10-CM

## 2021-07-09 DIAGNOSIS — I10 Essential (primary) hypertension: Secondary | ICD-10-CM

## 2021-07-09 LAB — ECHOCARDIOGRAM COMPLETE
AR max vel: 2.8 cm2
AV Area VTI: 2.85 cm2
AV Area mean vel: 2.69 cm2
AV Mean grad: 3.5 mmHg
AV Peak grad: 6.3 mmHg
Ao pk vel: 1.25 m/s
Area-P 1/2: 3.72 cm2
Calc EF: 53.1 %
Height: 70 in
S' Lateral: 3.3 cm
Single Plane A2C EF: 54.4 %
Single Plane A4C EF: 49.5 %
Weight: 3474.45 oz

## 2021-07-09 LAB — LIPID PANEL
Cholesterol: 140 mg/dL (ref 0–200)
HDL: 45 mg/dL (ref >40)
LDL Cholesterol: 82 mg/dL (ref 0–99)
Total CHOL/HDL Ratio: 3.1 RATIO
Triglycerides: 63 mg/dL (ref <150)
VLDL: 13 mg/dL (ref 0–40)

## 2021-07-09 LAB — TROPONIN I (HIGH SENSITIVITY): Troponin I (High Sensitivity): 3 ng/L (ref <18)

## 2021-07-09 LAB — MRSA NEXT GEN BY PCR, NASAL: MRSA by PCR Next Gen: NOT DETECTED

## 2021-07-09 IMAGING — MR MR CERVICAL SPINE W/O CM
5 series · 37 of 48 positions shown · non-contrast
Comparison: None Available.

CLINICAL DATA: Left arm and leg weakness

EXAM:
MRI HEAD WITHOUT CONTRAST
MRI CERVICAL SPINE WITHOUT CONTRAST
TECHNIQUE: Multiplanar, multiecho pulse sequences of the brain and surrounding
structures, and cervical spine, to include the craniocervical
junction and cervicothoracic junction, were obtained without
intravenous contrast.

[Series 5: T2 · sagittal · 3.0mm · 0.69mm/px · 6 of 15 slices shown (1 of 2)]
[im 1/15]
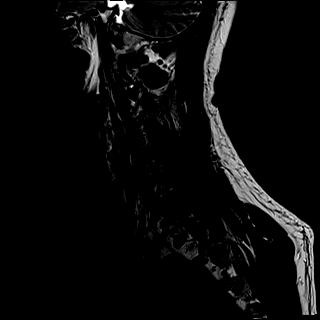
[im 3/15]
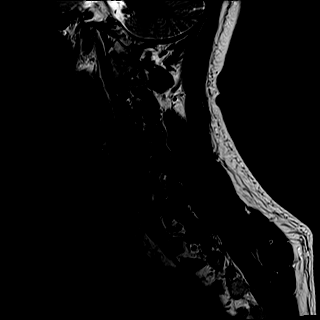
[im 6/15]
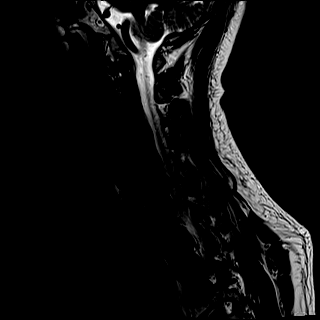
[im 9/15]
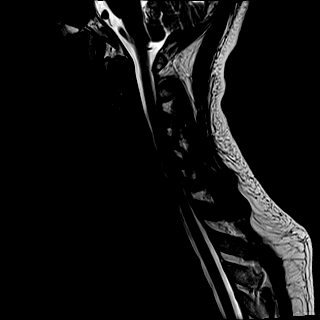
[im 12/15]
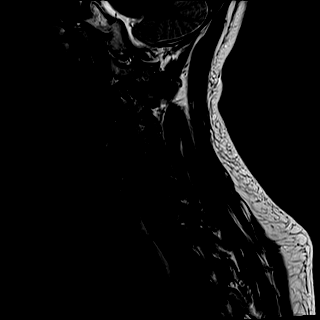
[im 15/15]
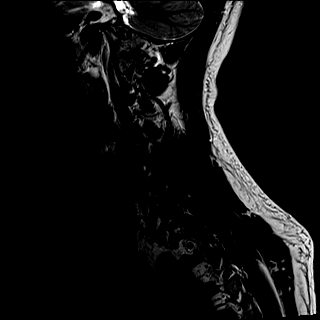

[Series 6: T1 · sagittal · 3.0mm · 0.69mm/px · 6 of 15 slices shown]
[im 1/15]
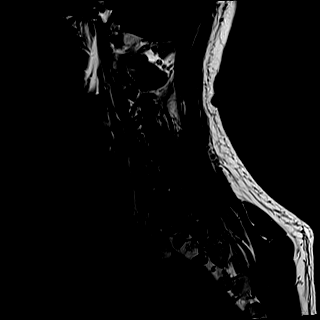
[im 3/15]
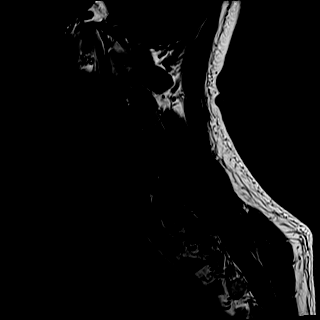
[im 6/15]
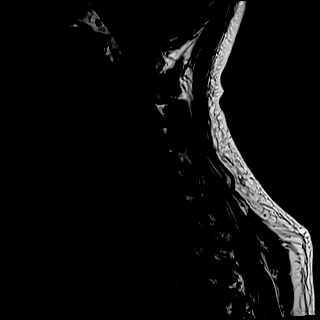
[im 9/15]
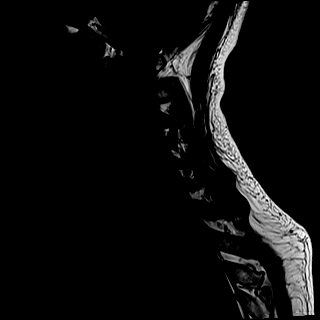
[im 12/15]
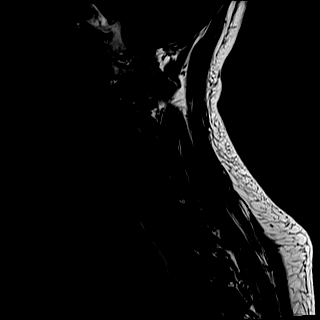
[im 15/15]
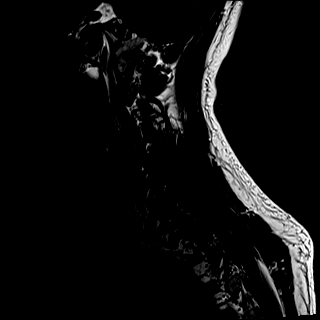

[Series 7: STIR · sagittal · 3.0mm · 0.86mm/px · 6 of 15 slices shown]
[im 1/15]
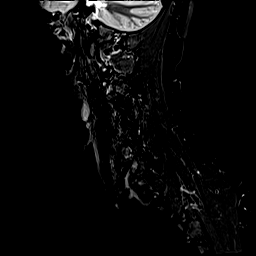
[im 3/15]
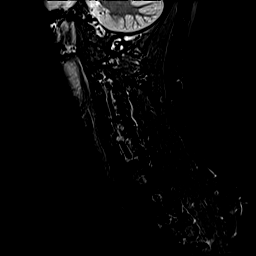
[im 6/15]
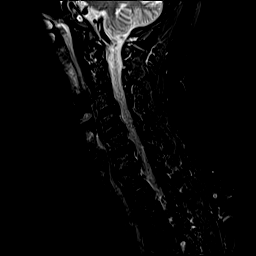
[im 9/15]
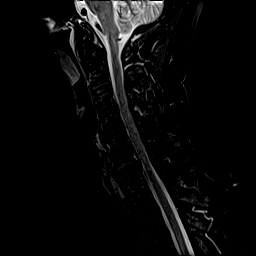
[im 12/15]
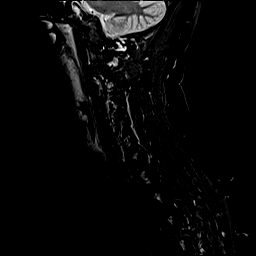
[im 15/15]
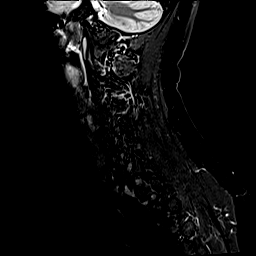

[Series 8: T2 · axial · 3.0mm · 0.66mm/px · z∈[-121,+2]mm · 11 of 40 slices shown (2 of 2)]
[im 1/40]
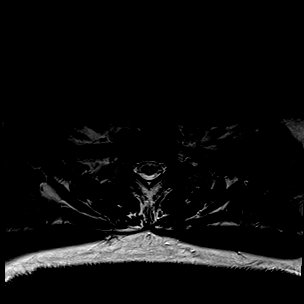
[im 3/40]
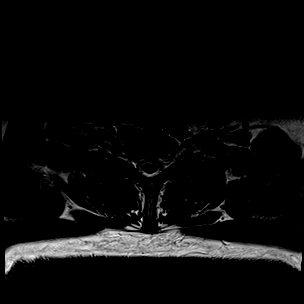
[im 6/40]
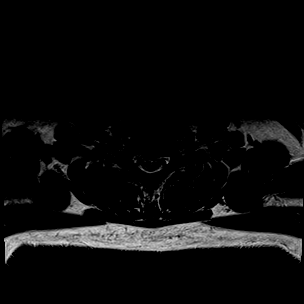
[im 9/40]
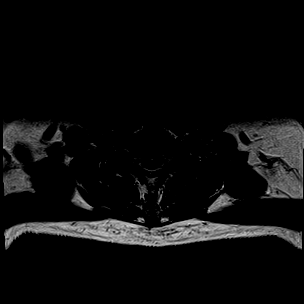
[im 12/40]
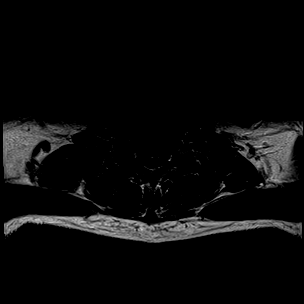
[im 17/40]
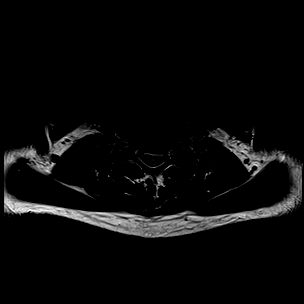
[im 20/40]
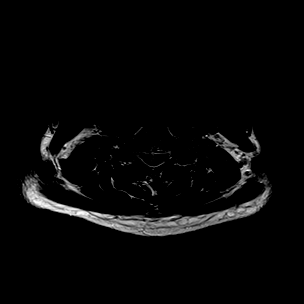
[im 23/40]
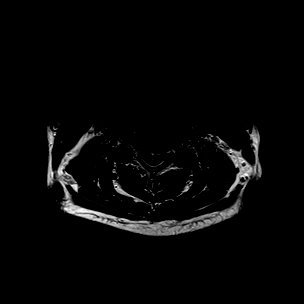
[im 28/40]
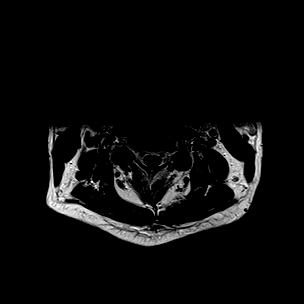
[im 34/40]
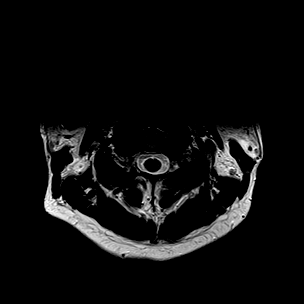
[im 40/40]
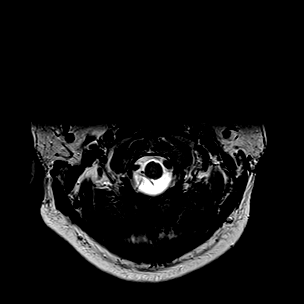

[Series 9: GRE · axial · 3.0mm · 0.39mm/px · z∈[-121,+2]mm · 8 of 40 slices shown]
[im 1/40]
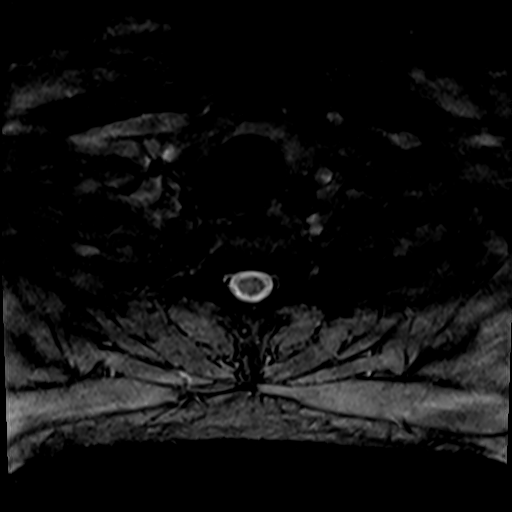
[im 6/40]
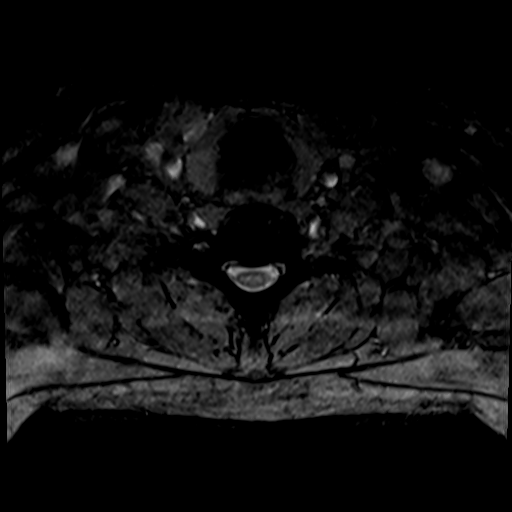
[im 12/40]
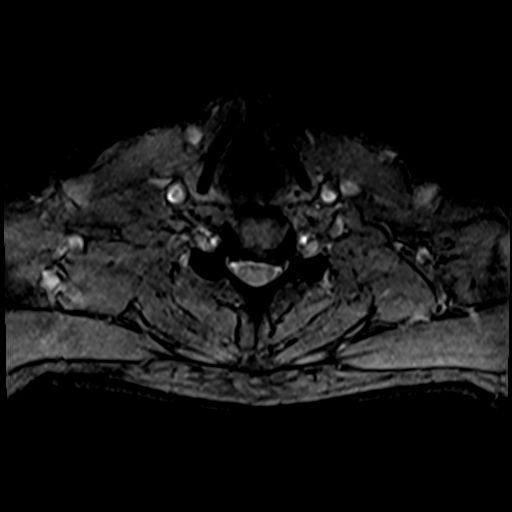
[im 17/40]
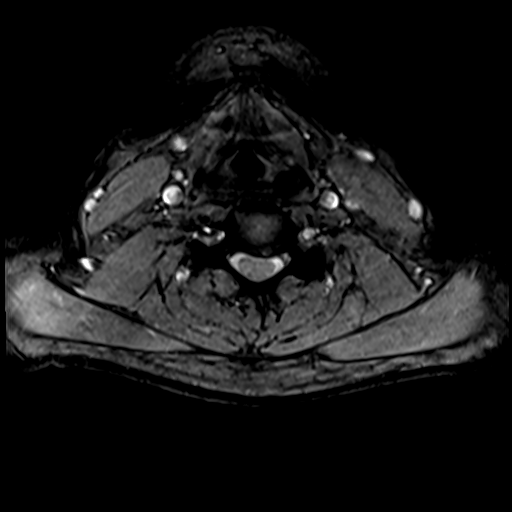
[im 23/40]
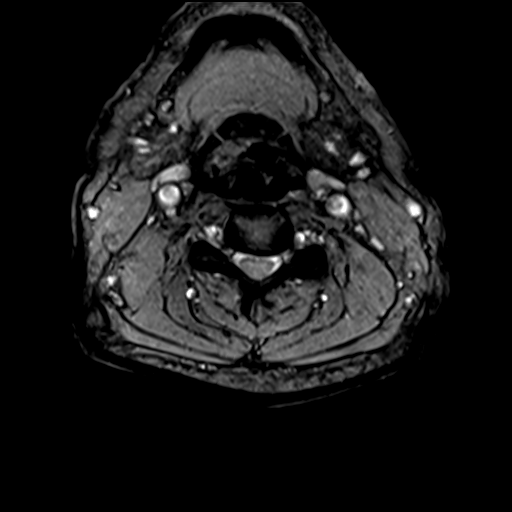
[im 28/40]
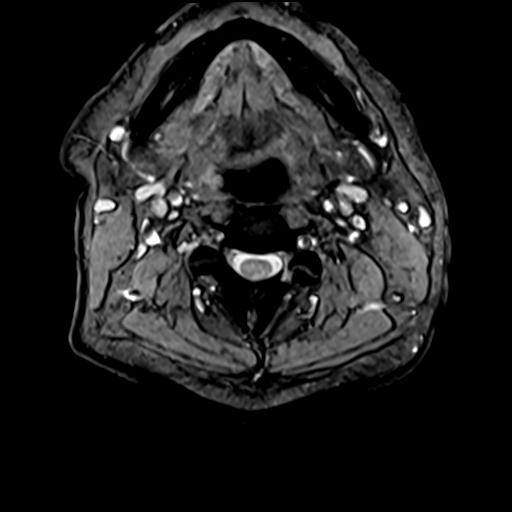
[im 34/40]
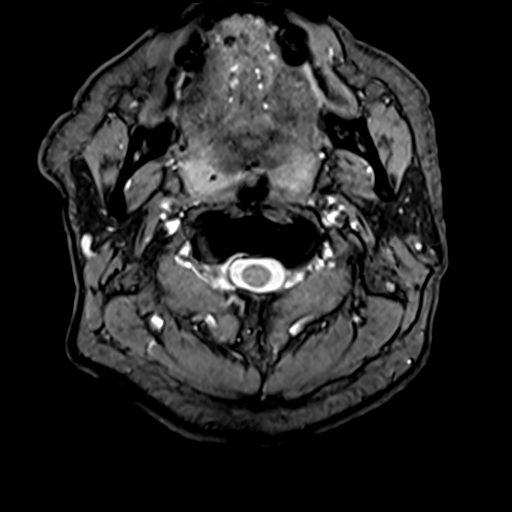
[im 40/40]
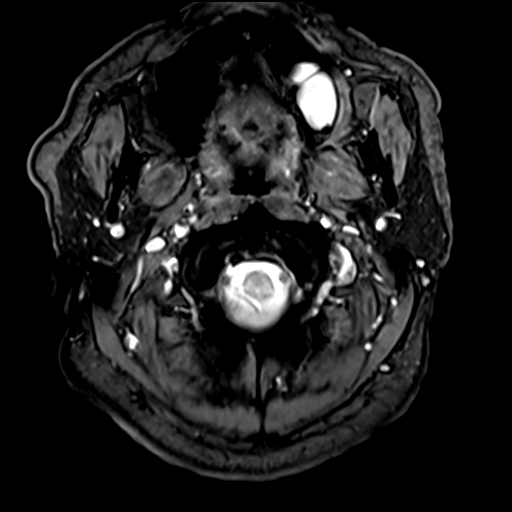

[37 of 48 positions shown; findings below may reference images not displayed]

FINDINGS: MRI HEAD FINDINGS

Brain: No acute infarct, mass effect or extra-axial collection. No
acute or chronic hemorrhage. Normal white matter signal, parenchymal
volume and CSF spaces. Incidentally noted cavum septum pellucidum et
vergae.

Vascular: Major flow voids are preserved.

Skull and upper cervical spine: Normal calvarium and skull base.
Visualized upper cervical spine and soft tissues are normal.

Sinuses/Orbits:No paranasal sinus fluid levels or advanced mucosal
thickening. No mastoid or middle ear effusion. Normal orbits.

MRI CERVICAL SPINE FINDINGS

Alignment: Physiologic.

Vertebrae: No fracture, evidence of discitis, or bone lesion.

Cord: Normal signal and morphology.

Posterior Fossa, vertebral arteries, paraspinal tissues: Negative.

Disc levels:

C1-2: Unremarkable.

C2-3: Normal disc space and facet joints. There is no spinal canal
stenosis. No neural foraminal stenosis.

C3-4: Normal disc space and facet joints. There is no spinal canal
stenosis. No neural foraminal stenosis.

C4-5: Small disc bulge. There is no spinal canal stenosis. No neural
foraminal stenosis.

C5-6: Small disc bulge. There is no spinal canal stenosis. Moderate
left neural foraminal stenosis.

C6-7: Small disc bulge. There is no spinal canal stenosis. Moderate
right and severe left neural foraminal stenosis.

C7-T1: Normal disc space and facet joints. There is no spinal canal
stenosis. No neural foraminal stenosis.
IMPRESSION: 1. Normal MRI of the brain.
2. Moderate left C5-6 and severe left C6-7 neural foraminal
stenosis.
3. No spinal canal stenosis.

## 2021-07-09 IMAGING — MR MR HEAD W/O CM
12 of 13 series · 44 of 48 positions shown · non-contrast
Comparison: None Available.

CLINICAL DATA: Stroke follow-up

EXAM:
MRI HEAD WITHOUT CONTRAST
TECHNIQUE: Multiplanar, multiecho pulse sequences of the brain and surrounding
structures were obtained without intravenous contrast.

[Series 5: DWI · axial · 3.0mm · 0.96mm/px · z∈[-98,+81]mm · 8 of 121 slices shown (1 of 4)]
[im 1/121]
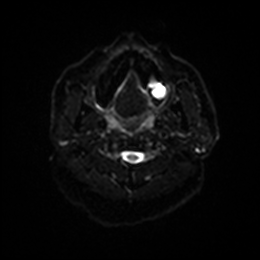
[im 18/121]
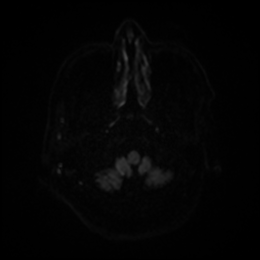
[im 35/121]
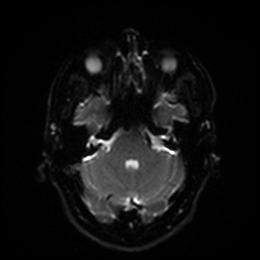
[im 52/121]
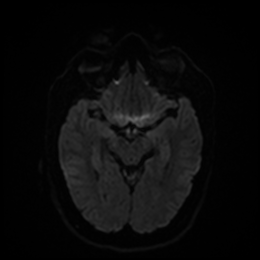
[im 69/121]
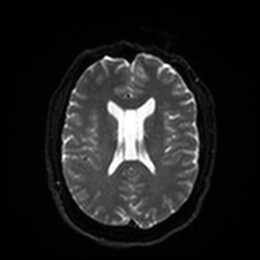
[im 86/121]
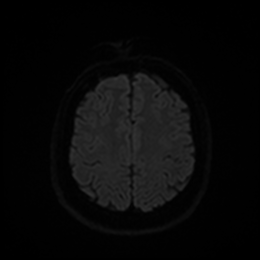
[im 103/121]
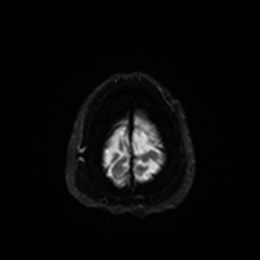
[im 121/121]
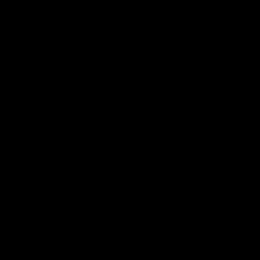

[Series 6: DWI · axial · 3.0mm · 0.96mm/px · z∈[-98,+78]mm · 4 of 60 slices shown (2 of 4)]
[im 1/60]
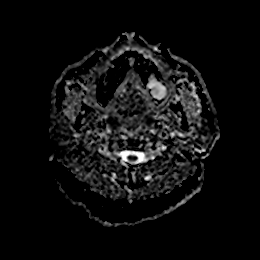
[im 20/60]
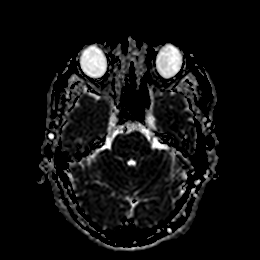
[im 40/60]
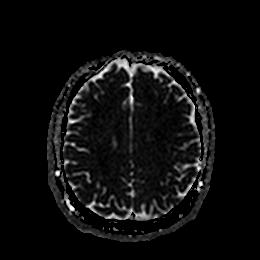
[im 60/60]
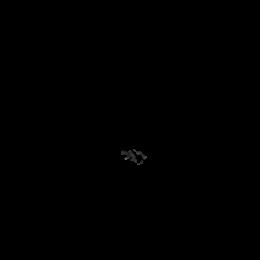

[Series 7: DWI · coronal · 4.0mm · 0.88mm/px · 5 of 83 slices shown (3 of 4)]
[im 1/83]
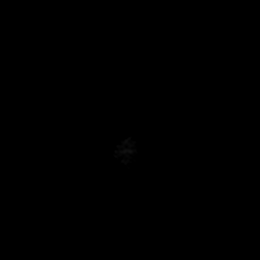
[im 21/83]
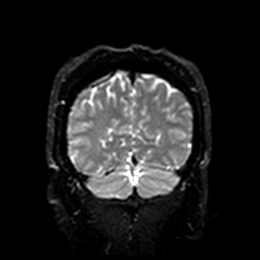
[im 42/83]
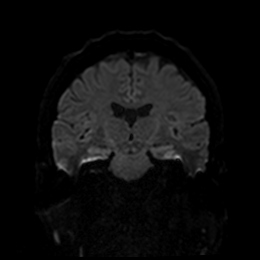
[im 62/83]
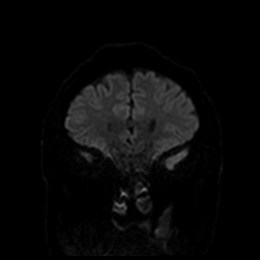
[im 83/83]
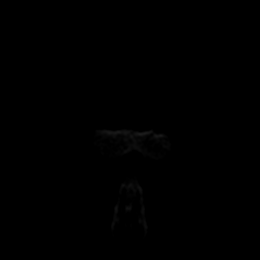

[Series 8: DWI · coronal · 4.0mm · 0.88mm/px · 3 of 43 slices shown (4 of 4)]
[im 1/43]
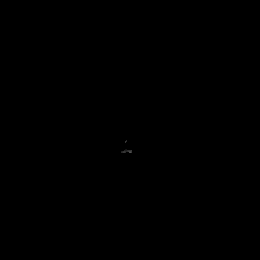
[im 22/43]
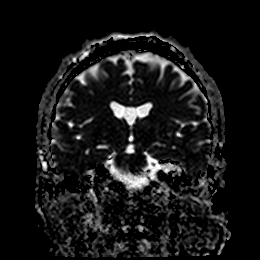
[im 43/43]
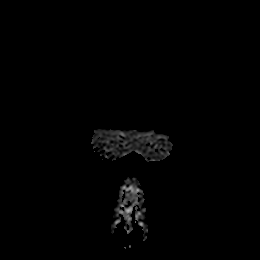

[Series 9: T1 · sagittal · 5.0mm · 0.75mm/px · 2 of 30 slices shown]
[im 1/30]
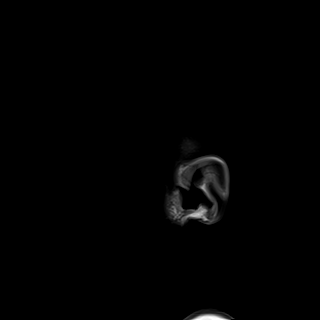
[im 30/30]
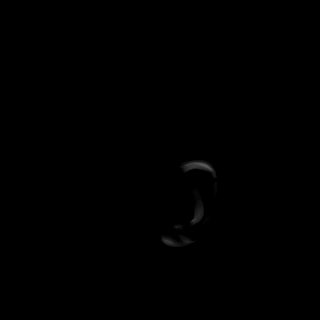

[Series 10: T2 · axial · 5.0mm · 0.78mm/px · z∈[-95,+78]mm · 2 of 30 slices shown (1 of 2)]
[im 1/30]
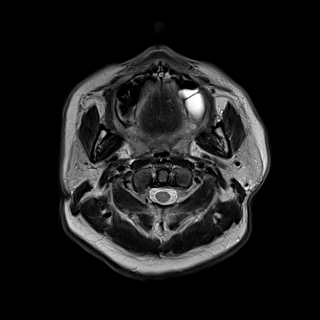
[im 30/30]
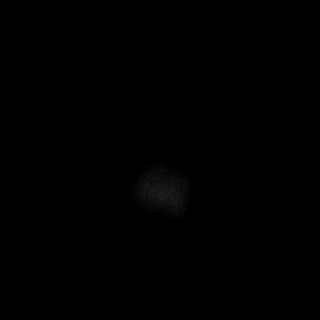

[Series 11: FLAIR · axial · 5.0mm · 0.49mm/px · z∈[-93,+80]mm · 2 of 30 slices shown]
[im 1/30]
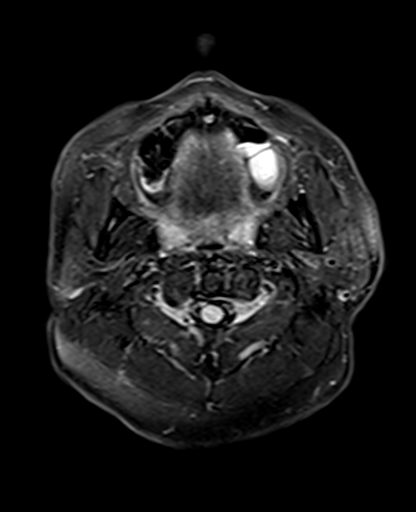
[im 30/30]
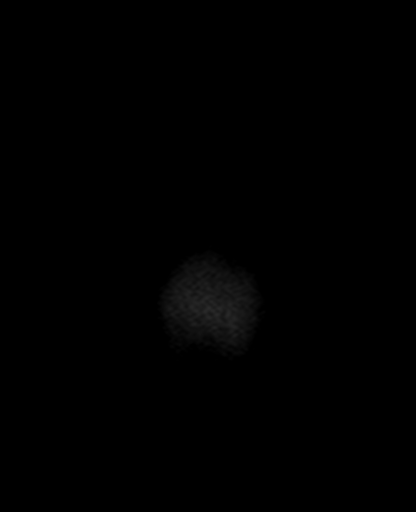

[Series 12: mag_images · axial · 3.0mm · 0.98mm/px · z∈[-88,+88]mm · 4 of 60 slices shown]
[im 1/60]
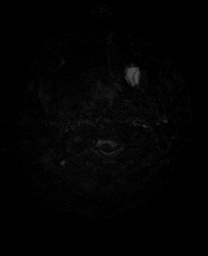
[im 20/60]
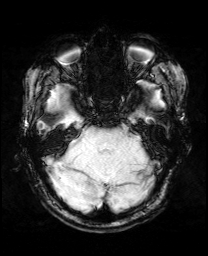
[im 40/60]
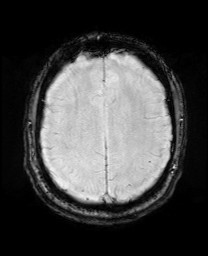
[im 60/60]
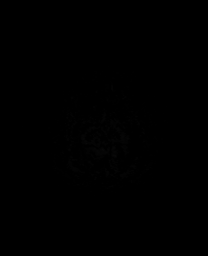

[Series 13: pha_images · axial · 3.0mm · 0.98mm/px · z∈[-88,+85]mm · 4 of 59 slices shown]
[im 1/59]
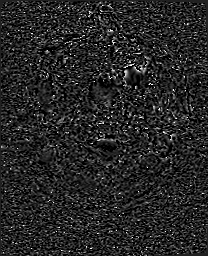
[im 20/59]
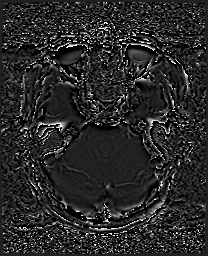
[im 39/59]
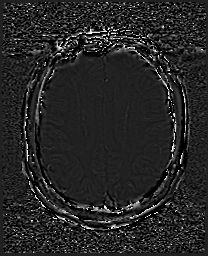
[im 59/59]
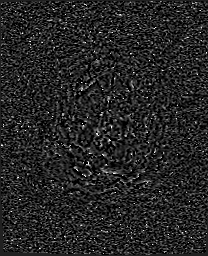

[Series 14: swi_images · axial · 3.0mm · 0.98mm/px · z∈[-88,+88]mm · 4 of 60 slices shown]
[im 1/60]
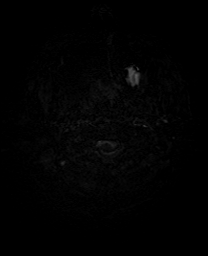
[im 20/60]
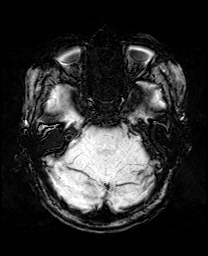
[im 40/60]
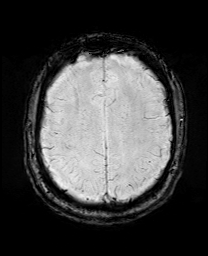
[im 60/60]
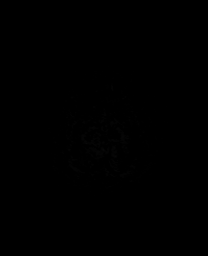

[Series 15: mip_images(sw) · axial · 24.0mm · 0.98mm/px · z∈[-77,+77]mm · 4 of 53 slices shown]
[im 1/53]
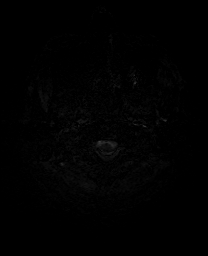
[im 18/53]
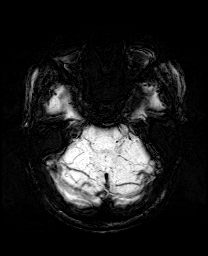
[im 35/53]
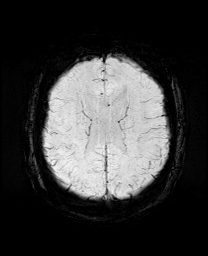
[im 53/53]
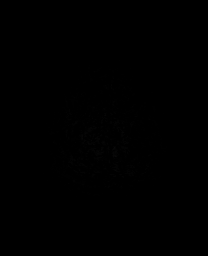

[Series 17: T2 · coronal · 5.0mm · 0.34mm/px · 2 of 37 slices shown (2 of 2)]
[im 1/37]
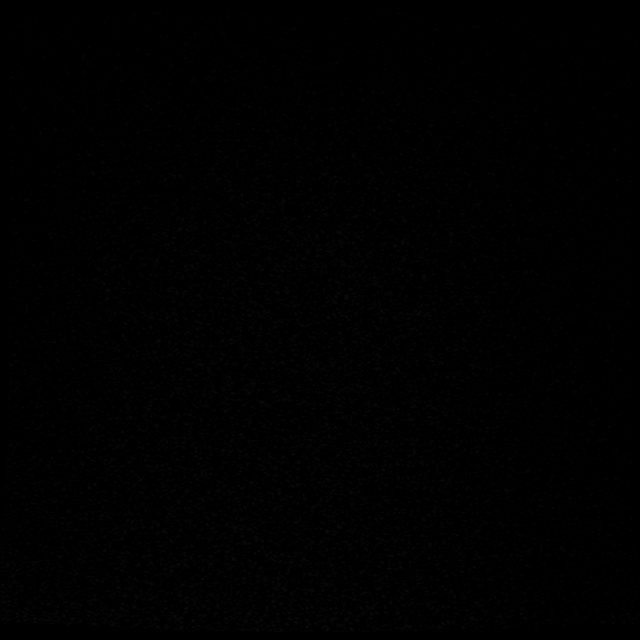
[im 37/37]
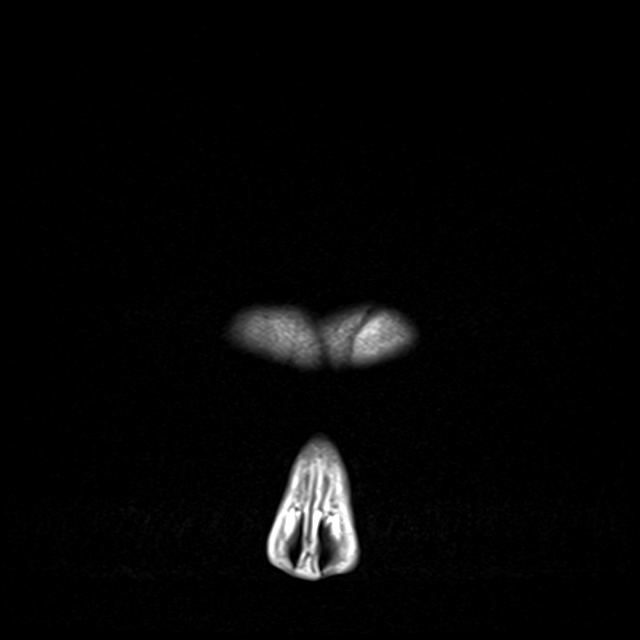

[44 of 48 positions shown; findings below may reference images not displayed]

FINDINGS: Brain: No acute infarct, mass effect or extra-axial collection. No
acute or chronic hemorrhage. Normal white matter signal, parenchymal
volume and CSF spaces. Cavum septum pellucidum et vergae

Vascular: Major flow voids are preserved.

Skull and upper cervical spine: Normal calvarium and skull base.
Visualized upper cervical spine and soft tissues are normal.

Sinuses/Orbits:No paranasal sinus fluid levels or advanced mucosal
thickening. No mastoid or middle ear effusion. Normal orbits.
IMPRESSION: Normal brain MRI.

## 2021-07-09 IMAGING — MR MR HEAD W/O CM
10 of 11 series · 43 of 48 positions shown · non-contrast
Comparison: None Available.

CLINICAL DATA: Left arm and leg weakness

EXAM:
MRI HEAD WITHOUT CONTRAST
MRI CERVICAL SPINE WITHOUT CONTRAST
TECHNIQUE: Multiplanar, multiecho pulse sequences of the brain and surrounding
structures, and cervical spine, to include the craniocervical
junction and cervicothoracic junction, were obtained without
intravenous contrast.

[Series 5: DWI · axial · 3.0mm · 0.88mm/px · z∈[-108,+38]mm · 10 of 100 slices shown (1 of 4)]
[im 1/100]
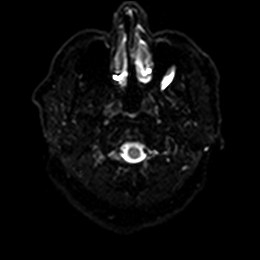
[im 12/100]
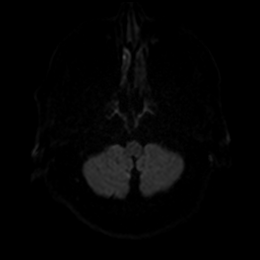
[im 23/100]
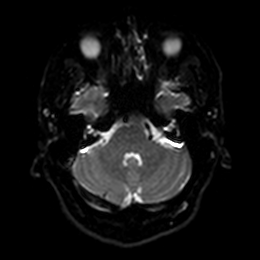
[im 34/100]
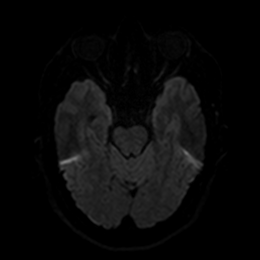
[im 45/100]
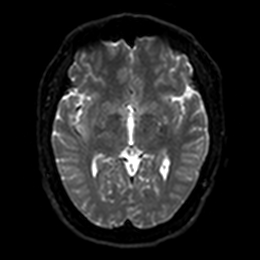
[im 56/100]
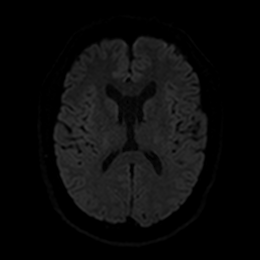
[im 67/100]
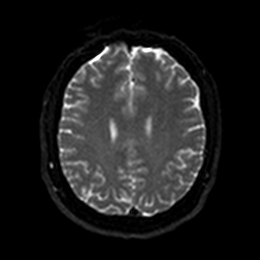
[im 78/100]
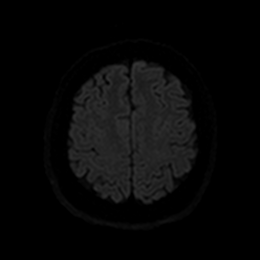
[im 89/100]
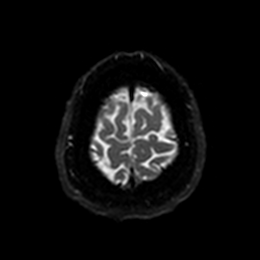
[im 100/100]
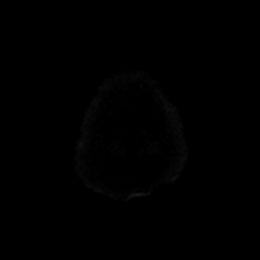

[Series 6: DWI · axial · 3.0mm · 0.88mm/px · z∈[-108,+38]mm · 5 of 50 slices shown (2 of 4)]
[im 1/50]
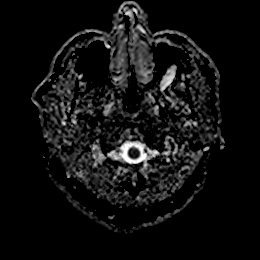
[im 13/50]
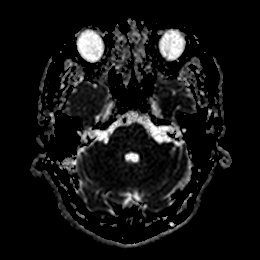
[im 25/50]
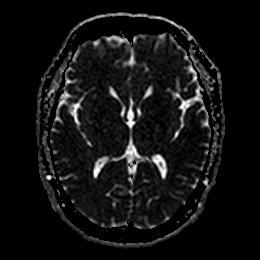
[im 37/50]
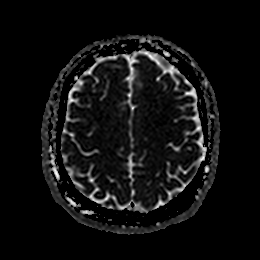
[im 50/50]
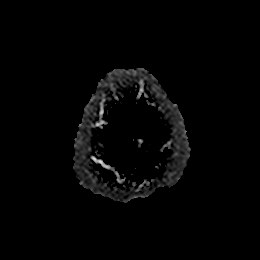

[Series 7: DWI · coronal · 4.0mm · 0.88mm/px · 6 of 64 slices shown (3 of 4)]
[im 1/64]
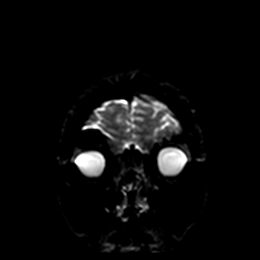
[im 13/64]
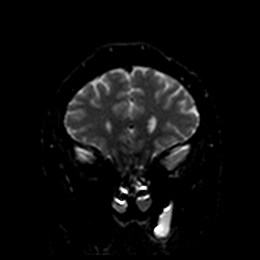
[im 26/64]
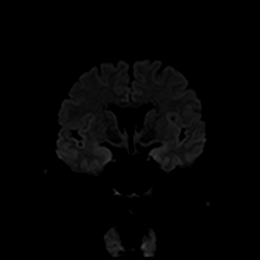
[im 38/64]
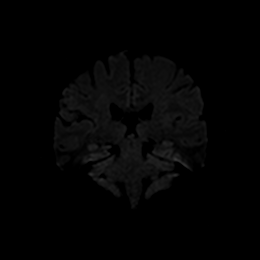
[im 51/64]
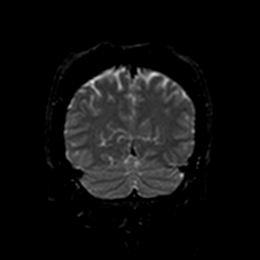
[im 64/64]
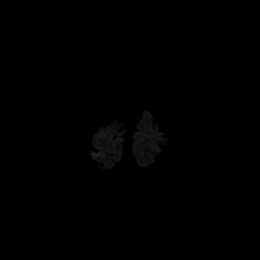

[Series 8: DWI · coronal · 4.0mm · 0.88mm/px · 3 of 32 slices shown (4 of 4)]
[im 1/32]
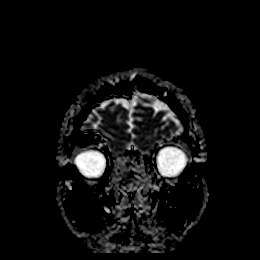
[im 16/32]
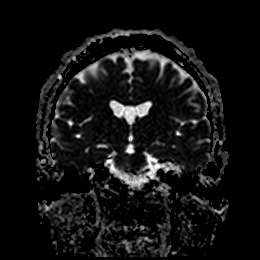
[im 32/32]
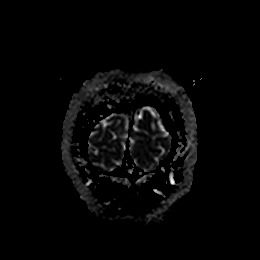

[Series 9: T1 · sagittal · 5.0mm · 0.78mm/px · 2 of 23 slices shown]
[im 1/23]
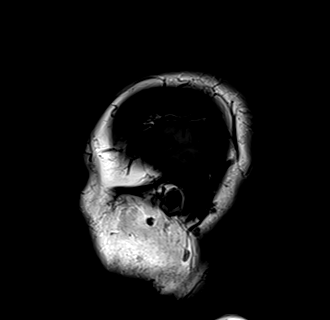
[im 23/23]
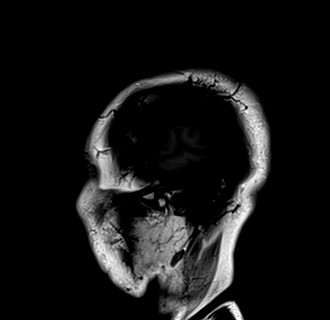

[Series 10: T2 · axial · 5.0mm · 0.72mm/px · z∈[-112,+43]mm · 2 of 27 slices shown (1 of 2)]
[im 1/27]
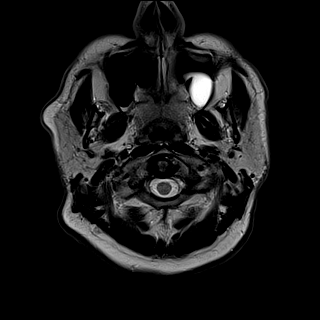
[im 27/27]
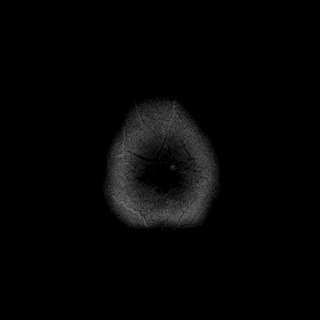

[Series 11: FLAIR · axial · 5.0mm · 0.45mm/px · z∈[-113,+43]mm · 2 of 27 slices shown]
[im 1/27]
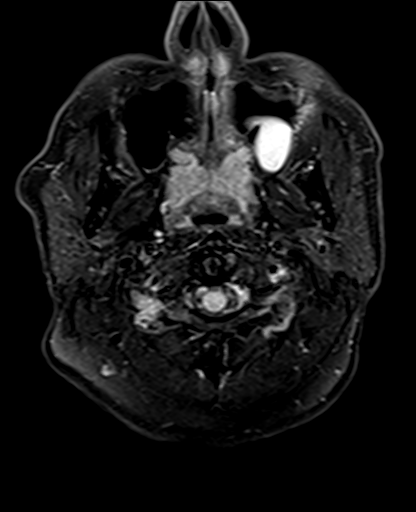
[im 27/27]
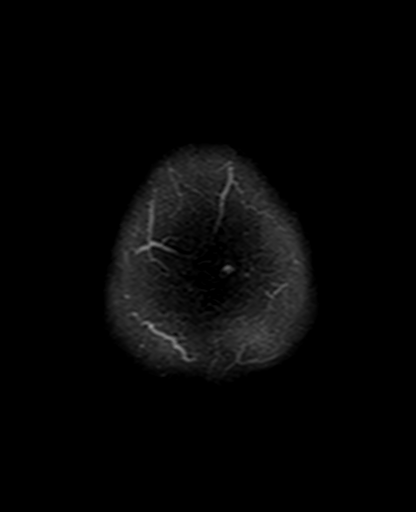

[Series 13: pha_images · axial · 3.0mm · 0.90mm/px · z∈[-119,+54]mm · 5 of 59 slices shown]
[im 1/59]
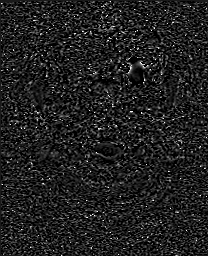
[im 15/59]
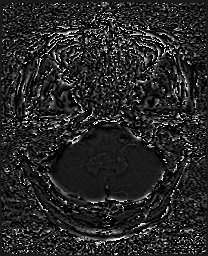
[im 30/59]
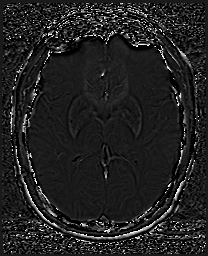
[im 44/59]
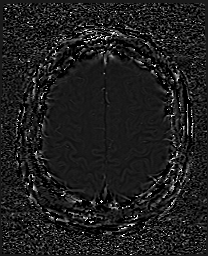
[im 59/59]
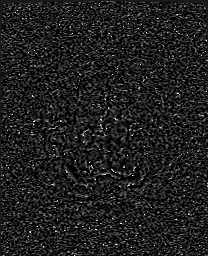

[Series 14: swi_images · axial · 3.0mm · 0.90mm/px · z∈[-119,+57]mm · 5 of 60 slices shown]
[im 1/60]
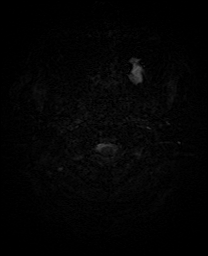
[im 15/60]
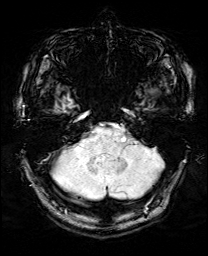
[im 30/60]
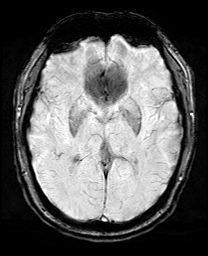
[im 45/60]
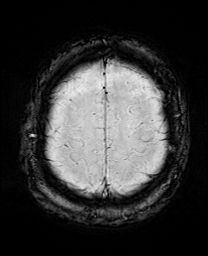
[im 60/60]
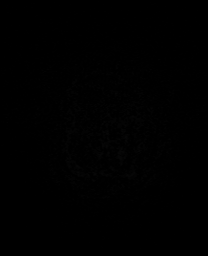

[Series 17: T2 · coronal · 5.0mm · 0.34mm/px · 3 of 29 slices shown (2 of 2)]
[im 1/29]
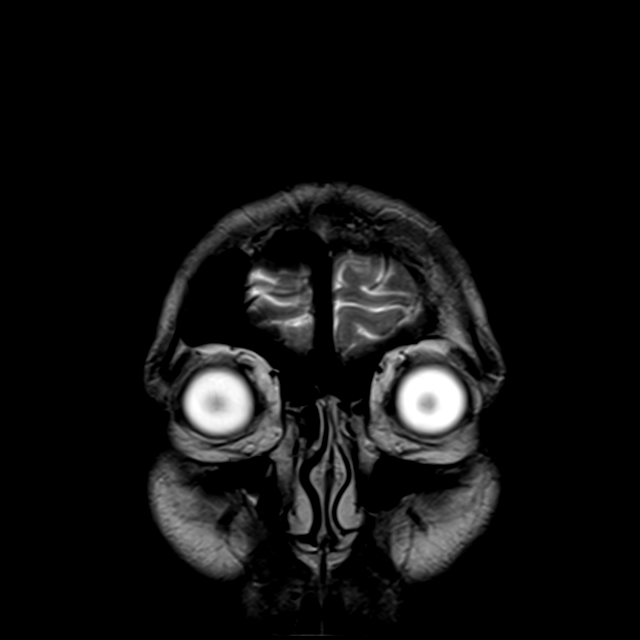
[im 15/29]
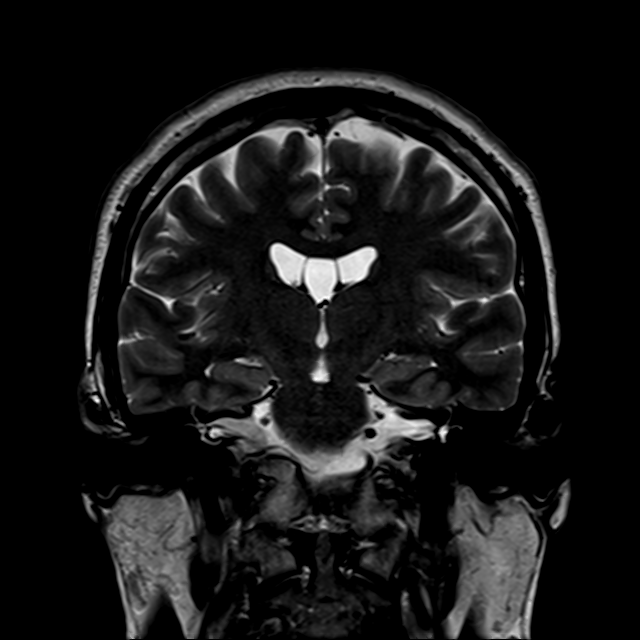
[im 29/29]
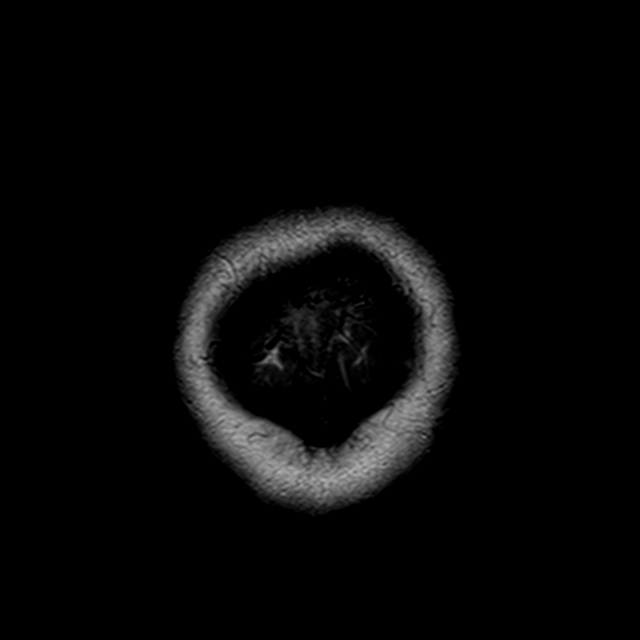

[43 of 48 positions shown; findings below may reference images not displayed]

FINDINGS: MRI HEAD FINDINGS

Brain: No acute infarct, mass effect or extra-axial collection. No
acute or chronic hemorrhage. Normal white matter signal, parenchymal
volume and CSF spaces. Incidentally noted cavum septum pellucidum et
vergae.

Vascular: Major flow voids are preserved.

Skull and upper cervical spine: Normal calvarium and skull base.
Visualized upper cervical spine and soft tissues are normal.

Sinuses/Orbits:No paranasal sinus fluid levels or advanced mucosal
thickening. No mastoid or middle ear effusion. Normal orbits.

MRI CERVICAL SPINE FINDINGS

Alignment: Physiologic.

Vertebrae: No fracture, evidence of discitis, or bone lesion.

Cord: Normal signal and morphology.

Posterior Fossa, vertebral arteries, paraspinal tissues: Negative.

Disc levels:

C1-2: Unremarkable.

C2-3: Normal disc space and facet joints. There is no spinal canal
stenosis. No neural foraminal stenosis.

C3-4: Normal disc space and facet joints. There is no spinal canal
stenosis. No neural foraminal stenosis.

C4-5: Small disc bulge. There is no spinal canal stenosis. No neural
foraminal stenosis.

C5-6: Small disc bulge. There is no spinal canal stenosis. Moderate
left neural foraminal stenosis.

C6-7: Small disc bulge. There is no spinal canal stenosis. Moderate
right and severe left neural foraminal stenosis.

C7-T1: Normal disc space and facet joints. There is no spinal canal
stenosis. No neural foraminal stenosis.
IMPRESSION: 1. Normal MRI of the brain.
2. Moderate left C5-6 and severe left C6-7 neural foraminal
stenosis.
3. No spinal canal stenosis.

## 2021-07-09 MED ORDER — PANTOPRAZOLE SODIUM 40 MG PO TBEC
40.0000 mg | DELAYED_RELEASE_TABLET | Freq: Every day | ORAL | Status: DC
  Administered 2021-07-09 – 2021-07-10 (×2): 40 mg via ORAL
  Filled 2021-07-09 (×2): qty 1

## 2021-07-09 MED ORDER — ROSUVASTATIN CALCIUM 5 MG PO TABS
5.0000 mg | ORAL_TABLET | Freq: Every day | ORAL | Status: DC
  Administered 2021-07-10: 5 mg via ORAL
  Filled 2021-07-09: qty 1

## 2021-07-09 MED ORDER — ACETAMINOPHEN 325 MG PO TABS
650.0000 mg | ORAL_TABLET | ORAL | Status: DC | PRN
  Administered 2021-07-09 (×2): 650 mg via ORAL
  Filled 2021-07-09 (×2): qty 2

## 2021-07-09 MED ORDER — POTASSIUM CHLORIDE CRYS ER 20 MEQ PO TBCR
40.0000 meq | EXTENDED_RELEASE_TABLET | Freq: Once | ORAL | Status: AC
  Administered 2021-07-09: 40 meq via ORAL
  Filled 2021-07-09: qty 2

## 2021-07-09 MED ORDER — CHLORHEXIDINE GLUCONATE CLOTH 2 % EX PADS
6.0000 | MEDICATED_PAD | Freq: Every day | CUTANEOUS | Status: DC
  Administered 2021-07-09: 6 via TOPICAL

## 2021-07-09 MED ORDER — ASPIRIN EC 81 MG PO TBEC
81.0000 mg | DELAYED_RELEASE_TABLET | Freq: Every day | ORAL | Status: DC
  Administered 2021-07-10: 81 mg via ORAL
  Filled 2021-07-09: qty 1

## 2021-07-09 NOTE — Progress Notes (Addendum)
STROKE TEAM PROGRESS NOTE  ? ?INTERVAL HISTORY ?No family at the bedside. Acute onset of left side weakness while eating dinner. Amlodipine 5mg  recently started by PCP. ? ?Vitals:  ? 07/09/21 0600 07/09/21 0630 07/09/21 0700 07/09/21 0800  ?BP: (!) 122/92 109/88 (!) 101/91 (!) 131/94  ?Pulse: (!) 59 61 71 65  ?Resp: 12 11 14 14   ?Temp:    98 ?F (36.7 ?C)  ?TempSrc:    Oral  ?SpO2: 97% 97% 97% 98%  ?Weight:      ?Height:      ? ?CBC:  ?Recent Labs  ?Lab 07/08/21 ?2140 07/08/21 ?2208  ?WBC 6.0  --   ?NEUTROABS 3.4  --   ?HGB 15.9 16.0  ?HCT 45.8 47.0  ?MCV 85.8  --   ?PLT 213  --   ? ?Basic Metabolic Panel:  ?Recent Labs  ?Lab 07/08/21 ?2140 07/08/21 ?2208  ?NA 137 139  ?K 3.9 3.8  ?CL 104 101  ?CO2 25  --   ?GLUCOSE 101* 94  ?BUN 24* 26*  ?CREATININE 0.92 0.90  ?CALCIUM 9.4  --   ? ?Lipid Panel:  ?Recent Labs  ?Lab 07/09/21 ?0400  ?CHOL 140  ?TRIG 63  ?HDL 45  ?CHOLHDL 3.1  ?VLDL 13  ?LDLCALC 82  ? ?HgbA1c:  ?Recent Labs  ?Lab 07/08/21 ?2339  ?HGBA1C 5.2  ? ?Urine Drug Screen:  ?Recent Labs  ?Lab 07/08/21 ?2218  ?LABOPIA NONE DETECTED  ?COCAINSCRNUR NONE DETECTED  ?LABBENZ NONE DETECTED  ?AMPHETMU NONE DETECTED  ?THCU NONE DETECTED  ?LABBARB NONE DETECTED  ?  ?Alcohol Level  ?Recent Labs  ?Lab 07/08/21 ?2140  ?ETH <10  ? ? ?IMAGING past 24 hours ?MR BRAIN WO CONTRAST ? ?Result Date: 07/09/2021 ?CLINICAL DATA:  Stroke follow-up EXAM: MRI HEAD WITHOUT CONTRAST TECHNIQUE: Multiplanar, multiecho pulse sequences of the brain and surrounding structures were obtained without intravenous contrast. COMPARISON:  None Available. FINDINGS: Brain: No acute infarct, mass effect or extra-axial collection. No acute or chronic hemorrhage. Normal white matter signal, parenchymal volume and CSF spaces. Cavum septum pellucidum et vergae Vascular: Major flow voids are preserved. Skull and upper cervical spine: Normal calvarium and skull base. Visualized upper cervical spine and soft tissues are normal. Sinuses/Orbits:No paranasal sinus  fluid levels or advanced mucosal thickening. No mastoid or middle ear effusion. Normal orbits. IMPRESSION: Normal brain MRI. Electronically Signed   By: Deatra RobinsonKevin  Herman M.D.   On: 07/09/2021 03:25  ? ?CT HEAD CODE STROKE WO CONTRAST ? ?Result Date: 07/08/2021 ?CLINICAL DATA:  Code stroke. Neuro deficit, acute, stroke suspected. Left-sided weakness. EXAM: CT HEAD WITHOUT CONTRAST TECHNIQUE: Contiguous axial images were obtained from the base of the skull through the vertex without intravenous contrast. RADIATION DOSE REDUCTION: This exam was performed according to the departmental dose-optimization program which includes automated exposure control, adjustment of the mA and/or kV according to patient size and/or use of iterative reconstruction technique. COMPARISON:  None Available. FINDINGS: Brain: Normal appearance without evidence of old or acute infarction, mass lesion, hemorrhage, hydrocephalus or extra-axial collection. Vascular: Premature vascular calcification of the major vessels at the base of the brain, particularly the distal vertebral arteries. One could question a hyperdense focus within the right M1 segment, but this is not definite. Skull: Normal Sinuses/Orbits: Clear/normal Other: None ASPECTS (Alberta Stroke Program Early CT Score) - Ganglionic level infarction (caudate, lentiform nuclei, internal capsule, insula, M1-M3 cortex): 7 - Supraganglionic infarction (M4-M6 cortex): 3 Total score (0-10 with 10 being normal): 10 IMPRESSION: 1. Normal appearance of  the brain itself. Premature vascular calcification for a person of this age, particularly visible in the distal vertebral arteries. One could question a hyperdense focus in the right M1 segment but this is not definite. 2. ASPECTS is 10. 3. These results were communicated to Dr. Otelia Limes at 9:48 pm on 07/08/2021 by text page via the [facility] messaging system. Electronically Signed   By: Paulina Fusi M.D.   On: 07/08/2021 21:50  ? ?CT ANGIO HEAD NECK W WO CM W  PERF (CODE STROKE) ? ?Result Date: 07/08/2021 ?CLINICAL DATA:  Left-sided weakness EXAM: CT ANGIOGRAPHY HEAD AND NECK CT PERFUSION BRAIN TECHNIQUE: Multidetector CT imaging of the head and neck was performed using the standard protocol during bolus administration of intravenous contrast. Multiplanar CT image reconstructions and MIPs were obtained to evaluate the vascular anatomy. Carotid stenosis measurements (when applicable) are obtained utilizing NASCET criteria, using the distal internal carotid diameter as the denominator. Multiphase CT imaging of the brain was performed following IV bolus contrast injection. Subsequent parametric perfusion maps were calculated using RAPID software. RADIATION DOSE REDUCTION: This exam was performed according to the departmental dose-optimization program which includes automated exposure control, adjustment of the mA and/or kV according to patient size and/or use of iterative reconstruction technique. CONTRAST:  OMNIPAQUE IOHEXOL 350 MG/ML SOLN COMPARISON:  None Available. FINDINGS: CTA NECK FINDINGS SKELETON: There is no bony spinal canal stenosis. No lytic or blastic lesion. OTHER NECK: Normal pharynx, larynx and major salivary glands. No cervical lymphadenopathy. Unremarkable thyroid gland. UPPER CHEST: No pneumothorax or pleural effusion. No nodules or masses. AORTIC ARCH: There is no calcific atherosclerosis of the aortic arch. There is no aneurysm, dissection or hemodynamically significant stenosis of the visualized portion of the aorta. Conventional 3 vessel aortic branching pattern. The visualized proximal subclavian arteries are widely patent. RIGHT CAROTID SYSTEM: Normal without aneurysm, dissection or stenosis. LEFT CAROTID SYSTEM: Normal without aneurysm, dissection or stenosis. VERTEBRAL ARTERIES: Left dominant configuration. Both origins are clearly patent. There is no dissection, occlusion or flow-limiting stenosis to the skull base (V1-V3 segments). CTA HEAD  FINDINGS POSTERIOR CIRCULATION: --Vertebral arteries: Normal V4 segments. --Inferior cerebellar arteries: Normal. --Basilar artery: Normal. --Superior cerebellar arteries: Normal. --Posterior cerebral arteries (PCA): Normal. ANTERIOR CIRCULATION: --Intracranial internal carotid arteries: Normal. --Anterior cerebral arteries (ACA): Normal. Both A1 segments are present. Patent anterior communicating artery (a-comm). --Middle cerebral arteries (MCA): Normal. VENOUS SINUSES: As permitted by contrast timing, patent. ANATOMIC VARIANTS: None Review of the MIP images confirms the above findings. CT Brain Perfusion Findings: ASPECTS: 10 CBF (<30%) Volume: 57mL Perfusion (Tmax>6.0s) volume: 2mL Mismatch Volume: 65mL Infarction Location:None IMPRESSION: 1. Normal CTA of the head and neck. 2. Normal CT perfusion. Electronically Signed   By: Deatra Robinson M.D.   On: 07/08/2021 22:17   ? ?PHYSICAL EXAM ? ?Physical Exam  ?Constitutional: Appears well-developed and well-nourished.  ?Psych: Affect appropriate to situation ?Eyes: No scleral injection ?HENT: No OP obstrucion ?MSK: no joint deformities.  ?Cardiovascular: Normal rate and regular rhythm.  ?Respiratory: Effort normal, non-labored breathing ?GI: Soft.  No distension. There is no tenderness.  ?Skin: WDI ? ?Neuro: ?Mental Status: ?Patient is awake, alert, oriented to person, place, month, year, and situation. ?Patient is able to give a clear and coherent history. ?No signs of aphasia or neglect ?Cranial Nerves: ?II: Visual Fields are full. Pupils are equal, round, and reactive to light.   ?III,IV, VI: EOMI without ptosis or diploplia.  ?V: Facial sensation is symmetric to temperature ?VII: Facial movement is symmetric resting  and smiling ?VIII: Hearing is intact to voice ?X: Palate elevates symmetrically ?XI: Shoulder shrug is symmetric. ?XII: Tongue protrudes midline without atrophy or fasciculations.  ?Motor: ?Tone is normal. Bulk is normal. No drift noted.  Giveway weakness  noted on the left. ?RUE 5/5  LUE 4+/5 ?RLE 5/5  LLE 4+/5 ?Sensory: ?Diminished light touch sensation on left side.  Increased sensation to cold on left side ?Cerebellar: ?FNF and HKS are intact bilater

## 2021-07-09 NOTE — TOC Initial Note (Signed)
Transition of Care (TOC) - Initial/Assessment Note  ? ? ?Patient Details  ?Name: Kevion Hungerford. ?MRN: [id number] ?Date of Birth: 1975/03/22 ? ?Transition of Care [facility]) CM/SW Contact:    ?Ella Bodo, RN ?Phone Number: ?07/09/2021, 4:15 PM ? ?Clinical Narrative:                 ?Prince Castiglioni. is an 46 y.o. male with a PMHx of anginal pain, DDD, kidney stones, HTN and OSA who presents with acute onset of left arm and leg weakness.  Recieved TNK.  MRI negative. ?PTA, pt independent and living in [place] with his girlfriend. PT/OT recommending OP follow up, and patient agreeable to continued therapies.  Referral to [facility] for follow up.  RW vs. Cane recommended for ambulation; patient states he would prefer cane.  Referral to [facility] for cane, to be delivered to bedside prior to dc.  ? ?Expected Discharge Plan: OP Rehab ?Barriers to Discharge: Barriers Resolved ? ? ?Patient Goals and CMS Choice ?Patient states their goals for this hospitalization and ongoing recovery are:: to go home ?  ?  ? ?Expected Discharge Plan and Services ?Expected Discharge Plan: OP Rehab ?  ?Discharge Planning Services: CM Consult ?  ?Living arrangements for the past 2 months: [place] ?                ?DME Arranged: Cane ?DME Agency: [employer] ?Date DME Agency Contacted: 07/09/21 ?Time DME Agency Contacted: [phone] ?Representative spoke with at DME Agency: Pura Spice ?  ?  ?  ?  ?  ? ?Prior Living Arrangements/Services ?Living arrangements for the past 2 months: [place] ?Lives with:: Significant Other ?Patient language and need for interpreter reviewed:: Yes ?Do you feel safe going back to the place where you live?: Yes      ?Need for Family Participation in Patient Care: Yes (Comment) ?Care giver support system in place?: Yes (comment) ?  ?Criminal Activity/Legal Involvement Pertinent to Current Situation/Hospitalization: No - Comment as needed ? ?Activities of Daily Living ?Home Assistive  Devices/Equipment: None ?ADL Screening (condition at time of admission) ?Patient's cognitive ability adequate to safely complete daily activities?: Yes ?Is the patient deaf or have difficulty hearing?: No ?Does the patient have difficulty seeing, even when wearing glasses/contacts?: No ?Does the patient have difficulty concentrating, remembering, or making decisions?: No ?Patient able to express need for assistance with ADLs?: Yes ?Does the patient have difficulty dressing or bathing?: Yes ?Independently performs ADLs?: No ?Communication: Independent ?Dressing (OT): Needs assistance ?Is this a change from baseline?: Change from baseline, expected to last >3 days ?Grooming: Needs assistance ?Is this a change from baseline?: Change from baseline, expected to last >3 days ?Feeding: Needs assistance ?Is this a change from baseline?: Change from baseline, expected to last >3 days ?Bathing: Needs assistance ?Is this a change from baseline?: Change from baseline, expected to last >3 days ?Toileting: Needs assistance ?Is this a change from baseline?: Change from baseline, expected to last >3days ?In/Out Bed: Needs assistance ?Is this a change from baseline?: Change from baseline, expected to last >3 days ?Walks in Home: Needs assistance ?Is this a change from baseline?: Change from baseline, expected to last >3 days ?Does the patient have difficulty walking or climbing stairs?: Yes ?Weakness of Legs: Left ?Weakness of Arms/Hands: Left ? ?  ?   ?   ?   ?   ? ?Emotional Assessment ?Appearance:: Appears stated age ?Attitude/Demeanor/Rapport: Engaged ?Affect (typically observed):  Accepting ?Orientation: : Oriented to Self, Oriented to Place, Oriented to  Time, Oriented to Situation ?  ?  ? 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 Diagnosis Date Noted  ? 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 Left ureteral stone 11/07/2020  ? Unstable angina ([place]) 11/01/2017  ? ?PCP:  Pcp,  No ?Pharmacy:   ?[id number] [address] ?[address] ?[place] [address] ?Phone: [phone] Fax: [phone] ? ?[place], [address] ?[address] ?[place] [address] ?Phone: [phone] Fax: [phone] ? ? ? ? ?Social Determinants of Health (SDOH) Interventions ?  ? ?Readmission Risk Interventions ?   ? View : No data to display.  ?  ?  ?  ? ?Reinaldo Raddle, RN, BSN  ?Trauma/Neuro ICU Case Manager ?[phone] ? ? ?

## 2021-07-09 NOTE — Progress Notes (Signed)
Dr. Otelia Limes paged x [phone], [phone]) to ask for diet orders, pain medication, and antiemetic. Will continue to monitor. ?

## 2021-07-09 NOTE — Evaluation (Signed)
Occupational Therapy Evaluation ?Patient Details ?Name: Curtis Brown. ?MRN: [id number] ?DOB: [birth year] ?Today's Date: 07/09/2021 ? ? ?History of Present Illness Curtis Brown. is an 46 y.o. male with a PMHx of anginal pain, DDD, kidney stones, HTN and OSA who presents with acute onset of left arm and leg weakness.  Recieved TNK.  MRI negative.  ? ?Clinical Impression ?  ?This 46 yo male admitted with above presents to acute OT with mild left UE weakness, numbness, and decreased coordination.Normally he is totally independent, works full time and drives. He will continue to benefit from acute OT with follow up OPOT.  ?   ? ?Recommendations for follow up therapy are one component of a multi-disciplinary discharge planning process, led by the attending physician.  Recommendations may be updated based on patient status, additional functional criteria and insurance authorization.  ? ?Follow Up Recommendations ? Outpatient OT  ?  ?Assistance Recommended at Discharge PRN  ?Patient can return home with the following A little help with bathing/dressing/bathroom;Assistance with cooking/housework;Assist for transportation ? ?  ?Functional Status Assessment ? Patient has had a recent decline in their functional status and demonstrates the ability to make significant improvements in function in a reasonable and predictable amount of time.  ?Equipment Recommendations ? None recommended by OT  ?  ?   ?Precautions / Restrictions Precautions ?Precautions: Fall ?Restrictions ?Weight Bearing Restrictions: No  ? ?  ? ?   ?   ? ?ADL either performed or assessed with clinical judgement  ? ?ADL Overall ADL's : Needs assistance/impaired ?Eating/Feeding: Independent;Sitting ?  ?Grooming: Min guard;Standing ?  ?Upper Body Bathing: Sitting;Set up ?  ?Lower Body Bathing: Min guard;Sit to/from stand ?  ?Upper Body Dressing : Set up;Sitting ?  ?Lower Body Dressing: Min guard;Sit to/from stand ?  ?  ?Toilet Transfer Details (indicate cue type  and reason): Based on visually observing him with PT ambulating in hallway pt is at a minguard A level when up on his feet. ?  ?  ?  ?Web designer Details (indicate cue type and reason): Educated on use of shower seat for safety in the short run ?  ?General ADL Comments: Educated on not holding anything breakable or hot with left hand only  ? ? ? ?Vision Baseline Vision/History: 0 No visual deficits ?Ability to See in Adequate Light: 0 Adequate ?Patient Visual Report: No change from baseline ?Vision Assessment?: Yes ?Eye Alignment: Within Functional Limits ?Ocular Range of Motion: Within Functional Limits ?Alignment/Gaze Preference: Within Defined Limits ?Tracking/Visual Pursuits: Able to track stimulus in all quads without difficulty ?Saccades: Within functional limits ?Convergence: Within functional limits ?Visual Fields: No apparent deficits  ?   ?   ?   ? ?Pertinent Vitals/Pain Pain Assessment ?Pain Assessment: Faces ?Faces Pain Scale: Hurts even more ?Pain Location: L shoulder with certain movements (report it has been this way for some while, cannot lay on that side at night, tends to me worse in the morning and then the more he works it is not painful anymore). ?Pain Descriptors / Indicators: Discomfort, Aching, Sore ?Pain Intervention(s): Limited activity within patient's tolerance, Monitored during session, Repositioned  ? ? ? ?Hand Dominance Left ?  ?Extremity/Trunk Assessment Upper Extremity Assessment ?Upper Extremity Assessment: LUE deficits/detail ?LUE Deficits / Details: AROM WFL, strength 4/5, decreased coordination for in hand manipulation. Reports hand/arm feel numb from finger tips to shoulder. ?LUE Sensation: decreased light touch ?  ? ?  ?  ?Communication Communication ?Communication: No difficulties ?  ?  Cognition Arousal/Alertness: Awake/alert ?Behavior During Therapy: [facility] for tasks assessed/performed ?Overall Cognitive Status: Within Functional Limits for tasks assessed ?  ?  ?  ?  ?  ?   ?  ?  ?  ?  ?  ?  ?  ?  ?  ?  ?  ?  ?  ?General Comments  girlfriend in the room and supportive ? ?  ?Exercises Other Exercises ?Other Exercises: encouarged him to use, use, use his LUE as he would normally given that it is his dominant arm ?  ?   ? ? ?Home Living Family/patient expects to be discharged to:: Private residence ?Living Arrangements: Spouse/significant other ?Available Help at Discharge: Available 24 hours/day;Family ?Type of Home: House ?Home Access: Level entry ?  ?  ?Home Layout: Two level ?Alternate Level Stairs-Number of Steps: flight ?Alternate Level Stairs-Rails: Left;Right ?Bathroom Shower/Tub: Walk-in shower ?  ?Bathroom Toilet: Standard ?  ?  ?Home Equipment: Grab bars - tub/shower;Shower seat ?  ?Additional Comments: girlfriend's home, dad's home is 6 STE and one level house. ?  ? ?  ?Prior Functioning/Environment Prior Level of Function : Independent/Modified Independent;Driving;Working/employed ?  ?  ?  ?  ?  ?  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 ?  ?  ? ?  ?  ?OT Problem List: Decreased strength;Impaired balance (sitting and/or standing);Impaired UE functional use;Pain ?  ?   ?OT Treatment/Interventions: Therapeutic activities;Therapeutic exercise  ?  ?OT Goals(Current goals can be found in the care plan section) Acute Rehab OT Goals ?Patient Stated Goal: for left side to get better ?OT Goal Formulation: With patient ?Time For Goal Achievement: 07/23/21 ?Potential to Achieve Goals: Good  ?OT Frequency: Min 2X/week ?  ? ?   ?AM-PAC OT "6 Clicks" Daily Activity     ?Outcome Measure Help from another person eating meals?: None ?Help from another person taking care of personal grooming?: A Little ?Help from another person toileting, which includes using toliet, bedpan, or urinal?: A Little ?Help from another person bathing (including washing, rinsing, drying)?: A Little ?Help from another person to put on and taking off regular upper body clothing?:  A Little ?Help from another person to put on and taking off regular lower body clothing?: A Little ?6 Click Score: 19 ?  ?End of Session   ? ?Activity Tolerance: Patient tolerated treatment well ?Patient left: in bed;with call bell/phone within reach ? ?OT Visit Diagnosis: Unsteadiness on feet (R26.81);Other abnormalities of gait and mobility (R26.89);Hemiplegia and hemiparesis;Muscle weakness (generalized) (M62.81);Pain ?Hemiplegia - Right/Left: Left ?Hemiplegia - dominant/non-dominant: Dominant ?Hemiplegia - caused by:  (MRI is negative) ?Pain - Right/Left: Left ?Pain - part of body: Shoulder  ?              ?Time: 8546-2703 ?OT Time Calculation (min): 27 min ?Charges:  OT General Charges ?$OT Visit: 1 Visit ?OT Evaluation ?$OT Eval Moderate Complexity: 1 Mod ?OT Treatments ?$Self Care/Home Management : 8-22 mins ?Ignacia Palma, OTR/L ?Acute Rehab Services ?Pager [phone] ?Office [phone] ? ? ? ?Evette Georges ?07/09/2021, 3:16 PM ?

## 2021-07-09 NOTE — Progress Notes (Signed)
?  Transition of Care (TOC) Screening Note ? ? ?Patient Details  ?Name: Curtis Brown. ?Date of Birth: [birth year] ? ? ?Transition of Care (TOC) CM/SW Contact:    ?Mearl Latin, LCSW ?Phone Number: ?07/09/2021, 11:05 AM ? ? ? ?Transition of Care Department [facility]) has reviewed patient and no TOC needs have been identified at this time. We will continue to monitor patient advancement through interdisciplinary progression rounds. If new patient transition needs arise, please place a TOC consult. ? ? ?

## 2021-07-09 NOTE — Evaluation (Signed)
Speech Language Pathology Evaluation ?Patient Details ?Name: Curtis Brown. ?MRN: [id number] ?DOB: [birth year] ?Today's Date: 07/09/2021 ?Time: 5397-6734 ?SLP Time Calculation (min) (ACUTE ONLY): 10 min ? ?Problem List:  ?Patient Active Problem List  ? Diagnosis Date Noted  ? 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 Left ureteral stone 11/07/2020  ? Unstable angina (HCC) 11/01/2017  ? ?Past Medical History:  ?Past Medical History:  ?Diagnosis Date  ? Anginal pain (HCC)   ? Complication of anesthesia   ? DDD (degenerative disc disease), lumbar   ? Family history of adverse reaction to anesthesia   ? PONV  ? History of kidney stones   ? Hypertension   ? diet controlled  ? Kidney stone   ? PONV (postoperative nausea and vomiting)   ? Sleep apnea   ? no CPAP  ? ?Past Surgical History:  ?Past Surgical History:  ?Procedure Laterality Date  ? arthroscopic knee Left 2015  ? x 2 surgeries  ? CYSTOSCOPY W/ URETERAL STENT PLACEMENT Left 11/07/2020  ? Procedure: CYSTOSCOPY WITH RETROGRADE PYELOGRAM/URETERAL STENT PLACEMENT;  Surgeon: Bjorn Pippin, MD;  Location: [facility] ORS;  Service: Urology;  Laterality: Left;  ? CYSTOSCOPY/URETEROSCOPY/HOLMIUM LASER/STENT PLACEMENT Left 12/11/2020  ? Procedure: CYSTOSCOPY/URETEROSCOPY/HOLMIUM LASER/STENT EXCHANGE;  Surgeon: Sondra Come, MD;  Location: [facility] ORS;  Service: Urology;  Laterality: Left;  ? FRACTURE SURGERY    ? tibia with rod 2000  ? HERNIA REPAIR    ? INGUINAL HERNIA REPAIR    ? LEG SURGERY    ? ?HPI:  ?Curtis Brown. is an 46 y.o. male with a PMHx of anginal pain, DDD, kidney stones, HTN and OSA who presents with acute onset of left arm and leg weakness.  Recieved TNK.  MRI negative. Works as IT sales professional at [employer].  ? ?Assessment / Plan / Recommendation ?Clinical Impression ? Mr. Crear presents with normal cognitive-linguistic function with attention, recall, problem-solving, awareness within normal parameters. Speech is clear without dysarthria.  Output is fluent.  Language normal. No SLP needs identified. No f/u necessary. Our service will sign off. ?   ?SLP Assessment ? SLP Recommendation/Assessment: Patient does not need any further Speech Lanaguage Pathology Services ?SLP Visit Diagnosis: Cognitive communication deficit (R41.841)  ?  ?Recommendations for follow up therapy are one component of a multi-disciplinary discharge planning process, led by the attending physician.  Recommendations may be updated based on patient status, additional functional criteria and insurance authorization. ?   ?Follow Up Recommendations ?   No f/u needed ?  ?    ?    ?    ?  ?  ?   ?SLP Evaluation ?Cognition ? Overall Cognitive Status: Within Functional Limits for tasks assessed ?Arousal/Alertness: Awake/alert ?Orientation Level: Oriented X4 ?Attention: Selective ?Selective Attention: Appears intact ?Memory: Appears intact ?Awareness: Appears intact ?Problem Solving: Appears intact  ?  ?   ?Comprehension ? Auditory Comprehension ?Overall Auditory Comprehension: Appears within functional limits for tasks assessed ?Visual Recognition/Discrimination ?Discrimination: Not tested  ?  ?Expression Expression ?Primary Mode of Expression: Verbal ?Verbal Expression ?Overall Verbal Expression: Appears within functional limits for tasks assessed ?Written Expression ?Dominant Hand: Left   ?Oral / Motor ? Oral Motor/Sensory Function ?Overall Oral Motor/Sensory Function: Within functional limits ?Motor Speech ?Overall Motor Speech: Appears within functional limits for tasks assessed   ?        ? ?Blenda Mounts Laurice ?07/09/2021, 3:20 PM ?Kylei Purington L. Jadin Kagel, MA CCC/SLP ?Acute Rehabilitation Services ?Office number [phone] ?Pager [phone] ? ?

## 2021-07-09 NOTE — Evaluation (Addendum)
Physical Therapy Evaluation ?Patient Details ?Name: Curtis Brown. ?MRN: [id number] ?DOB: 12-Jun-1975 ?Today's Date: 07/09/2021 ? ?History of Present Illness ? Curtis Brown. is an 46 y.o. male with a PMHx of anginal pain, DDD, kidney stones, HTN and OSA who presents with acute onset of left arm and leg weakness.  Recieved TNK.  MRI negative.  ?Clinical Impression ? Patient presents with decreased mobility due to L side weakness and sensory deficits.  Currently min to minguard assist for mobility into the hallway.  Reports feeling off balance and demonstrated LOB in bathroom while washing hands reaching for sink to catch.  He previously was working doing lifting and living with his significant other in two level townhome.  He can also live with his dad in single level house.  Patient will benefit from skilled PT in the acute setting and follow up outpatient PT at d/c.    ?   ? ?Recommendations for follow up therapy are one component of a multi-disciplinary discharge planning process, led by the attending physician.  Recommendations may be updated based on patient status, additional functional criteria and insurance authorization. ? ?Follow Up Recommendations Outpatient PT ? ?  ?Assistance Recommended at Discharge Intermittent Supervision/Assistance  ?Patient can return home with the following ? A little help with walking and/or transfers;Help with stairs or ramp for entrance;Assist for transportation;Assistance with cooking/housework ? ?  ?Equipment Recommendations Other (comment) (cane vs walker)  ?Recommendations for Other Services ?    ?  ?Functional Status Assessment Patient has had a recent decline in their functional status and demonstrates the ability to make significant improvements in function in a reasonable and predictable amount of time.  ? ?  ?Precautions / Restrictions Precautions ?Precautions: Fall ?Restrictions ?Weight Bearing Restrictions: No  ? ?  ? ?Mobility ? Bed Mobility ?Overal bed mobility:  Needs Assistance ?Bed Mobility: Supine to Sit ?  ?  ?Supine to sit: Supervision, Min guard ?  ?  ?General bed mobility comments: increased time and guiding assist to bring L leg off bed ?  ? ?Transfers ?Overall transfer level: Needs assistance ?Equipment used: 1 person hand held assist ?Transfers: Sit to/from Stand ?Sit to Stand: Min assist ?  ?  ?  ?  ?  ?General transfer comment: assist given as pt unsure if he could stand ?  ? ?Ambulation/Gait ?Ambulation/Gait assistance: Min assist ?Gait Distance (Feet): 140 Feet ?Assistive device: 1 person hand held assist ?Gait Pattern/deviations: Step-to pattern, Step-through pattern, Decreased stride length, Antalgic, Decreased stance time - left ?  ?  ?  ?General Gait Details: reports L leg feels weird and noted decreased stance time, states also feels unsteady, HHA and A for balance ? ?Stairs ?Stairs:  (declined to practice steps) ?  ?  ?  ?  ? ?Wheelchair Mobility ?  ? ?Modified Rankin (Stroke Patients Only) ?Modified Rankin (Stroke Patients Only) ?Pre-Morbid Rankin Score: No symptoms ?Modified Rankin: Moderately severe disability ? ?  ? ?Balance Overall balance assessment: Needs assistance ?  ?Sitting balance-Leahy Scale: Good ?  ?  ?  ?Standing balance-Leahy Scale: Poor ?Standing balance comment: in bathroom at sink to wash hands with posterior LOB reaching for sink to catch ?  ?  ?  ?  ?  ?  ?  ?  ?  ?  ?  ?   ? ? ? ?Pertinent Vitals/Pain Pain Assessment ?Pain Assessment: Faces ?Faces Pain Scale: Hurts little more ?Pain Location: L shoulder ?Pain Descriptors / Indicators: Discomfort, Aching ?Pain  Intervention(s): Monitored during session, Premedicated before session  ? ? ?Home Living Family/patient expects to be discharged to:: Private residence ?Living Arrangements: Spouse/significant other ?Available Help at Discharge: Available 24 hours/day;Family ?Type of Home: House ?Home Access: Level entry ?  ?  ?Alternate Level Stairs-Number of Steps: flight ?Home Layout: Two  level ?Home Equipment: Grab bars - tub/shower;Shower seat ?Additional Comments: girlfriend's home, dad's home is 6 STE and one level house.  ?  ?Prior Function Prior Level of Function : Independent/Modified Independent;Driving;Working/employed ?  ?  ?  ?  ?  ?  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 ?  ?  ? ? ?Hand Dominance  ? Dominant Hand: Left ? ?  ?Extremity/Trunk Assessment  ? Upper Extremity Assessment ?Upper Extremity Assessment: LUE deficits/detail ?LUE Deficits / Details: AROM WFL, strength 4/5, decreased coordination for in hand manipulation. Reports hand/arm feel numb from finger tips to shoulder. ?LUE Sensation: decreased light touch ?  ? ?Lower Extremity Assessment ?Lower Extremity Assessment: LLE deficits/detail ?LLE Deficits / Details: AROM WFL, strength 4-/5 throughout ?LLE Sensation: decreased light touch (reports pins and needles) ?  ? ?   ?Communication  ? Communication: No difficulties  ?Cognition Arousal/Alertness: Awake/alert ?[facility] tasks assessed/performed ?Overall Cognitive Status: Within Functional Limits for tasks assessed ?  ?  ?  ?  ?  ?  ?  ?  ?  ?  ?  ?  ?  ?  ?  ?  ?  ?  ?  ? ?  ?General Comments General comments (skin integrity, edema, etc.): girlfriend in the room and supportive, BP 149/101 after ambulation ? ?  ?Exercises    ? ?Assessment/Plan  ?  ?PT Assessment Patient needs continued PT services  ?PT Problem List Decreased strength;Decreased mobility;Decreased balance;Decreased activity tolerance;Impaired sensation ? ?   ?  ?PT Treatment Interventions DME instruction;Therapeutic activities;Patient/family education;Therapeutic exercise;Gait training;Stair training;Balance training;Functional mobility training   ? ?PT Goals (Current goals can be found in the Care Plan section)  ?Acute Rehab PT Goals ?Patient Stated Goal: to return to baseline ?PT Goal Formulation: With patient/family ?Time For Goal Achievement:  07/23/21 ?Potential to Achieve Goals: Good ? ?  ?Frequency Min 4X/week ?  ? ? ?Co-evaluation   ?  ?  ?  ?  ? ? ?  ?AM-PAC PT "6 Clicks" Mobility  ?Outcome Measure Help needed turning from your back to your side while in a flat bed without using bedrails?: None ?Help needed moving from lying on your back to sitting on the side of a flat bed without using bedrails?: None ?Help needed moving to and from a bed to a chair (including a wheelchair)?: A Little ?Help needed standing up from a chair using your arms (e.g., wheelchair or bedside chair)?: A Little ?Help needed to walk in hospital room?: A Little ?Help needed climbing 3-5 steps with a railing? : Total ?6 Click Score: 18 ? ?  ?End of Session Equipment Utilized During Treatment: Gait belt ?Activity Tolerance: Patient limited by fatigue ?Patient left: in bed;with call bell/phone within reach;with family/visitor present;Other (comment) (OT in the room) ?  ?PT Visit Diagnosis: Other abnormalities of gait and mobility (R26.89);Hemiplegia and hemiparesis ?Hemiplegia - Right/Left: Left ?Hemiplegia - dominant/non-dominant: Dominant ?Hemiplegia - caused by: Unspecified ?  ? ?Time: [id number] ?PT Time Calculation (min) (ACUTE ONLY): 26 min ? ? ?Charges:   PT Evaluation ?$PT Eval Low Complexity: 1 Low ?PT Treatments ?$Gait Training: 8-22 mins ?  ?   ? ? ?  Sheran Lawless, PT ?Acute Rehabilitation Services ?Pager:[phone] ?Office:[phone] ?07/09/2021 ? ? ?Elray Mcgregor ?07/09/2021, 3:15 PM ? ?

## 2021-07-09 NOTE — Progress Notes (Signed)
Paged returned by Dr. Otelia Limes. Verbal orders for heart healthy diet, oral tylenol, and 4 mg zofran PRN if oral intake does not settle patient's nausea.  ?

## 2021-07-09 NOTE — Code Documentation (Signed)
Stroke Response Nurse Documentation ?Code Documentation ? ?Curtis Brown. is a 46 y.o. male arriving to [facility]  via [employer] on 5/4 with past medical hx of HTN. On No antithrombotic. Code stroke was activated by ED.  ? ?Patient from home where he was LKW at 2000 and now complaining of left sided weakness and numbness.  ? ?Stroke team at the bedside on patient arrival. Labs drawn and patient cleared for CT by Dr. Roslynn Amble. Patient to CT with team. NIHSS 3, see documentation for details and code stroke times. Patient with left arm weakness, left leg weakness, and left decreased sensation on exam. The following imaging was completed:  CT Head, CTA, and CTP. Patient is a candidate for IV Thrombolytic due to fixed neurological deficit. Patient is not not a candidate for IR due to No LVO.  ? ?Care Plan: TNK.  ? ?Bedside handoff with ED RN Clara.   ? ?Madelynn Done  ?Rapid Response RN ? ? ?

## 2021-07-10 DIAGNOSIS — I639 Cerebral infarction, unspecified: Secondary | ICD-10-CM | POA: Diagnosis not present

## 2021-07-10 MED ORDER — ASPIRIN 81 MG PO TBEC: 81.0000 mg | DELAYED_RELEASE_TABLET | Freq: Every day | ORAL | 11 refills | Status: AC

## 2021-07-10 MED ORDER — ROSUVASTATIN CALCIUM 5 MG PO TABS: 5.0000 mg | ORAL_TABLET | Freq: Every day | ORAL | 2 refills | Status: AC

## 2021-07-10 NOTE — Progress Notes (Signed)
Pt down to MRI. Transport without nurse.  ?

## 2021-07-10 NOTE — Plan of Care (Signed)
Pt d/c to home by car with family. Assessment stable. All questions answered. 

## 2021-07-10 NOTE — Discharge Summary (Addendum)
Stroke Discharge Summary  ?Patient ID: Curtis Brown.    l ?  MRN: [id number]    ?  DOB: [birth year] ? ?Date of Admission: 07/08/2021 ?Date of Discharge: 07/10/2021 ? ?Attending Physician: Lovett Sox MD ?Consultant(s):    None  ?Patient's PCP:  Pcp, No ? 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 ? ?Allergies as of 07/10/2021   ? ?   Reactions  ? Oxycodone-acetaminophen Hives  ? Can take Tylenol  ? Percocet [oxycodone-acetaminophen] Hives  ? ?  ? ?  ?Medication List  ?  ? ?TAKE these medications   ? ?acetaminophen 325 MG tablet ?Commonly known as: TYLENOL ?Take 650 mg by mouth every 6 (six) hours as needed for moderate pain. ?  ?amLODipine 5 MG tablet ?Commonly known as: NORVASC ?Take 5 mg by mouth daily. ?  ?aspirin 81 MG EC tablet ?Take 1 tablet (81 mg total) by mouth daily. Swallow whole. ?  ?docusate sodium 100 MG capsule ?Commonly known as: Colace ?Take 1 tablet once or twice daily as needed for constipation while taking narcotic pain medicine ?  ?ibuprofen 200 MG tablet ?Commonly known as: ADVIL ?Take 400 mg by mouth every 6 (six) hours as needed for moderate pain. ?  ?lidocaine 5 % ?Commonly known as: LIDODERM ?Place 1 patch onto the skin daily. For shoulder ?  ?rosuvastatin 5 MG tablet ?Commonly known as: CRESTOR ?Take 1 tablet (5 mg total) by mouth daily. ?  ? ?  ? ? ?LABORATORY STUDIES ?CBC ?   ?Component Value Date/Time  ? WBC 6.0 07/08/2021 2140  ? RBC 5.34 07/08/2021 2140  ? HGB 16.0 07/08/2021 2208  ? HCT 47.0 07/08/2021 2208  ? PLT 213 07/08/2021 2140  ? MCV 85.8 07/08/2021 2140  ? MCH 29.8 07/08/2021 2140  ? MCHC 34.7 07/08/2021 2140  ? RDW 12.4 07/08/2021 2140  ? LYMPHSABS 1.7 07/08/2021 2140  ? MONOABS 0.5 07/08/2021 2140  ? EOSABS 0.3 07/08/2021 2140  ? BASOSABS 0.1 07/08/2021 2140  ? ?CMP ?   ?Component Value Date/Time  ? NA 139 07/08/2021 2208  ? K 3.8 07/08/2021 2208  ? CL 101 07/08/2021 2208  ? CO2 25 07/08/2021 2140  ? GLUCOSE 94 07/08/2021 2208  ? BUN 26 (H) 07/08/2021 2208   ? CREATININE 0.90 07/08/2021 2208  ? CALCIUM 9.4 07/08/2021 2140  ? PROT 7.4 07/08/2021 2140  ? ALBUMIN 4.4 07/08/2021 2140  ? AST 21 07/08/2021 2140  ? ALT 23 07/08/2021 2140  ? ALKPHOS 62 07/08/2021 2140  ? BILITOT 0.9 07/08/2021 2140  ? GFRNONAA >60 07/08/2021 2140  ? GFRAA >60 08/21/2019 0422  ? ?COAGS ?Lab Results  ?Component Value Date  ? INR 1.0 07/08/2021  ? INR 0.94 11/01/2017  ? ?Lipid Panel ?   ?Component Value Date/Time  ? CHOL 140 07/09/2021 0400  ? TRIG 63 07/09/2021 0400  ? HDL 45 07/09/2021 0400  ? CHOLHDL 3.1 07/09/2021 0400  ? VLDL 13 07/09/2021 0400  ? LDLCALC 82 07/09/2021 0400  ? ?HgbA1C  ?Lab Results  ?Component Value Date  ? HGBA1C 5.2 07/08/2021  ? ?Urinalysis ?   ?Component Value Date/Time  ? COLORURINE YELLOW 07/08/2021 2219  ? APPEARANCEUR CLEAR 07/08/2021 2219  ? LABSPEC 1.029 07/08/2021 2219  ? PHURINE 6.0 07/08/2021 2219  ? GLUCOSEU NEGATIVE 07/08/2021 2219  ? HGBUR SMALL (A) 07/08/2021 2219  ? BILIRUBINUR NEGATIVE 07/08/2021 2219  ? KETONESUR NEGATIVE 07/08/2021 2219  ? PROTEINUR NEGATIVE 07/08/2021 2219  ? NITRITE NEGATIVE 07/08/2021  2219  ? LEUKOCYTESUR NEGATIVE 07/08/2021 2219  ? ?Urine Drug Screen  ?   ?Component Value Date/Time  ? LABOPIA NONE DETECTED 07/08/2021 2218  ? COCAINSCRNUR NONE DETECTED 07/08/2021 2218  ? LABBENZ NONE DETECTED 07/08/2021 2218  ? AMPHETMU NONE DETECTED 07/08/2021 2218  ? THCU NONE DETECTED 07/08/2021 2218  ? LABBARB NONE DETECTED 07/08/2021 2218  ?  ?Alcohol Level ?   ?Component Value Date/Time  ? ETH <10 07/08/2021 2140  ? ? ? ?SIGNIFICANT DIAGNOSTIC STUDIES ?MR BRAIN WO CONTRAST ? ?Result Date: 07/09/2021 ?CLINICAL DATA:  Left arm and leg weakness EXAM: MRI HEAD WITHOUT CONTRAST MRI CERVICAL SPINE WITHOUT CONTRAST TECHNIQUE: Multiplanar, multiecho pulse sequences of the brain and surrounding structures, and cervical spine, to include the craniocervical junction and cervicothoracic junction, were obtained without intravenous contrast. COMPARISON:  None  Available. FINDINGS: MRI HEAD FINDINGS Brain: No acute infarct, mass effect or extra-axial collection. No acute or chronic hemorrhage. Normal white matter signal, parenchymal volume and CSF spaces. Incidentally noted cavum septum pellucidum et vergae. Vascular: Major flow voids are preserved. Skull and upper cervical spine: Normal calvarium and skull base. Visualized upper cervical spine and soft tissues are normal. Sinuses/Orbits:No paranasal sinus fluid levels or advanced mucosal thickening. No mastoid or middle ear effusion. Normal orbits. MRI CERVICAL SPINE FINDINGS Alignment: Physiologic. Vertebrae: No fracture, evidence of discitis, or bone lesion. Cord: Normal signal and morphology. Posterior Fossa, vertebral arteries, paraspinal tissues: Negative. Disc levels: C1-2: Unremarkable. C2-3: Normal disc space and facet joints. There is no spinal canal stenosis. No neural foraminal stenosis. C3-4: Normal disc space and facet joints. There is no spinal canal stenosis. No neural foraminal stenosis. C4-5: Small disc bulge. There is no spinal canal stenosis. No neural foraminal stenosis. C5-6: Small disc bulge. There is no spinal canal stenosis. Moderate left neural foraminal stenosis. C6-7: Small disc bulge. There is no spinal canal stenosis. Moderate right and severe left neural foraminal stenosis. C7-T1: Normal disc space and facet joints. There is no spinal canal stenosis. No neural foraminal stenosis. IMPRESSION: 1. Normal MRI of the brain. 2. Moderate left C5-6 and severe left C6-7 neural foraminal stenosis. 3. No spinal canal stenosis. Electronically Signed   By: Deatra Robinson M.D.   On: 07/09/2021 20:59  ? ?MR BRAIN WO CONTRAST ? ?Result Date: 07/09/2021 ?CLINICAL DATA:  Stroke follow-up EXAM: MRI HEAD WITHOUT CONTRAST TECHNIQUE: Multiplanar, multiecho pulse sequences of the brain and surrounding structures were obtained without intravenous contrast. COMPARISON:  None Available. FINDINGS: Brain: No acute infarct,  mass effect or extra-axial collection. No acute or chronic hemorrhage. Normal white matter signal, parenchymal volume and CSF spaces. Cavum septum pellucidum et vergae Vascular: Major flow voids are preserved. Skull and upper cervical spine: Normal calvarium and skull base. Visualized upper cervical spine and soft tissues are normal. Sinuses/Orbits:No paranasal sinus fluid levels or advanced mucosal thickening. No mastoid or middle ear effusion. Normal orbits. IMPRESSION: Normal brain MRI. Electronically Signed   By: Deatra Robinson M.D.   On: 07/09/2021 03:25  ? ?MR CERVICAL SPINE WO CONTRAST ? ?Result Date: 07/09/2021 ?CLINICAL DATA:  Left arm and leg weakness EXAM: MRI HEAD WITHOUT CONTRAST MRI CERVICAL SPINE WITHOUT CONTRAST TECHNIQUE: Multiplanar, multiecho pulse sequences of the brain and surrounding structures, and cervical spine, to include the craniocervical junction and cervicothoracic junction, were obtained without intravenous contrast. COMPARISON:  None Available. FINDINGS: MRI HEAD FINDINGS Brain: No acute infarct, mass effect or extra-axial collection. No acute or chronic hemorrhage. Normal white matter signal, parenchymal volume and  CSF spaces. Incidentally noted cavum septum pellucidum et vergae. Vascular: Major flow voids are preserved. Skull and upper cervical spine: Normal calvarium and skull base. Visualized upper cervical spine and soft tissues are normal. Sinuses/Orbits:No paranasal sinus fluid levels or advanced mucosal thickening. No mastoid or middle ear effusion. Normal orbits. MRI CERVICAL SPINE FINDINGS Alignment: Physiologic. Vertebrae: No fracture, evidence of discitis, or bone lesion. Cord: Normal signal and morphology. Posterior Fossa, vertebral arteries, paraspinal tissues: Negative. Disc levels: C1-2: Unremarkable. C2-3: Normal disc space and facet joints. There is no spinal canal stenosis. No neural foraminal stenosis. C3-4: Normal disc space and facet joints. There is no spinal canal  stenosis. No neural foraminal stenosis. C4-5: Small disc bulge. There is no spinal canal stenosis. No neural foraminal stenosis. C5-6: Small disc bulge. There is no spinal canal stenosis. Moderate lef

## 2021-07-10 NOTE — Progress Notes (Signed)
Physical Therapy Treatment ?Patient Details ?Name: Curtis Brown. ?MRN: [id number] ?DOB: [birth year] ?Today's Date: 07/10/2021 ? ? ?History of Present Illness Curtis Brown. is an 46 y.o. male with a PMHx of anginal pain, DDD, kidney stones, HTN and OSA who presents with acute onset of left arm and leg weakness.  Recieved TNK.  MRI negative. ? ?  ?PT Comments  ? ? Pt tolerates treatment well, ambulating for increased distances. Pt does demonstrate increased lateral drift and mild losses of balance with dynamic gait challenges, however the pt is able to correct with stepping strategies and does not require physical assistance. Pt again refuses stair training assessment. Pt will benefit from continued gait and balance training in an effort to restore independence.   ?Recommendations for follow up therapy are one component of a multi-disciplinary discharge planning process, led by the attending physician.  Recommendations may be updated based on patient status, additional functional criteria and insurance authorization. ? ?Follow Up Recommendations ? Outpatient PT ?  ?  ?Assistance Recommended at Discharge Intermittent Supervision/Assistance  ?Patient can return home with the following A little help with walking and/or transfers;Help with stairs or ramp for entrance;Assist for transportation;Assistance with cooking/housework ?  ?Equipment Recommendations ? None recommended by PT  ?  ?Recommendations for Other Services   ? ? ?  ?Precautions / Restrictions Precautions ?Precautions: Fall ?Restrictions ?Weight Bearing Restrictions: No  ?  ? ?Mobility ? Bed Mobility ?Overal bed mobility: Modified Independent ?Bed Mobility: Supine to Sit, Sit to Supine ?  ?  ?Supine to sit: Modified independent (Device/Increase time) ?Sit to supine: Modified independent (Device/Increase time) ?  ?  ?  ? ?Transfers ?Overall transfer level: Needs assistance ?Equipment used: None ?Transfers: Sit to/from Stand ?Sit to Stand: Supervision ?  ?  ?   ?  ?  ?  ?  ? ?Ambulation/Gait ?Ambulation/Gait assistance: Supervision ?Gait Distance (Feet): 500 Feet ?Assistive device: None ?Gait Pattern/deviations: Step-through pattern, Drifts right/left ?Gait velocity: functional ?Gait velocity interpretation: 1.31 - 2.62 ft/sec, indicative of limited community ambulator ?  ?General Gait Details: pt with increased lateral drift initially, improves with continued ambulation distances. Pt with one mild loss of balance with attempts at increasing stride length for consecutive steps. Pt declines attempts at increasing gait speed. Pt turns well and performs head turns without signifcant gait deviation ? ? ?Stairs ?Stairs:  (pt refuses stair negotiation assessment) ?  ?  ?  ?  ? ? ?Wheelchair Mobility ?  ? ?Modified Rankin (Stroke Patients Only) ?Modified Rankin (Stroke Patients Only) ?Pre-Morbid Rankin Score: No symptoms ?Modified Rankin: Moderately severe disability ? ? ?  ?Balance Overall balance assessment: Needs assistance ?Sitting-balance support: Feet supported, No upper extremity supported ?Sitting balance-Leahy Scale: Normal ?  ?  ?Standing balance support: No upper extremity supported, During functional activity ?Standing balance-Leahy Scale: Good ?  ?Single Leg Stance - Right Leg: 10 ?  ?  ?  ?  ?Rhomberg - Eyes Closed: 30 ?  ?  ?  ?  ?  ?  ? ?  ?Cognition Arousal/Alertness: Awake/alert ?Behavior During Therapy: [facility] for tasks assessed/performed ?Overall Cognitive Status: Within Functional Limits for tasks assessed ?  ?  ?  ?  ?  ?  ?  ?  ?  ?  ?  ?  ?  ?  ?  ?  ?  ?  ?  ? ?  ?Exercises   ? ?  ?General Comments General comments (skin integrity, edema, etc.): VSS on  RA ?  ?  ? ?Pertinent Vitals/Pain Pain Assessment ?Pain Assessment: No/denies pain  ? ? ?Home Living   ?  ?  ?  ?  ?  ?  ?  ?  ?  ?   ?  ?Prior Function    ?  ?  ?   ? ?PT Goals (current goals can now be found in the care plan section) Acute Rehab PT Goals ?Patient Stated Goal: to return to baseline ?Progress  towards PT goals: Progressing toward goals ? ?  ?Frequency ? ? ? Min 4X/week ? ? ? ?  ?PT Plan Current plan remains appropriate  ? ? ?Co-evaluation   ?  ?  ?  ?  ? ?  ?AM-PAC PT "6 Clicks" Mobility   ?Outcome Measure ? Help needed turning from your back to your side while in a flat bed without using bedrails?: None ?Help needed moving from lying on your back to sitting on the side of a flat bed without using bedrails?: None ?Help needed moving to and from a bed to a chair (including a wheelchair)?: A Little ?Help needed standing up from a chair using your arms (e.g., wheelchair or bedside chair)?: A Little ?Help needed to walk in hospital room?: A Little ?Help needed climbing 3-5 steps with a railing? : A Little ?6 Click Score: 20 ? ?  ?End of Session   ?Activity Tolerance: Patient tolerated treatment well ?Patient left: in bed;with call bell/phone within reach ?Nurse Communication: Mobility status ?PT Visit Diagnosis: Other abnormalities of gait and mobility (R26.89);Hemiplegia and hemiparesis ?Hemiplegia - Right/Left: Left ?Hemiplegia - dominant/non-dominant: Dominant ?Hemiplegia - caused by: Unspecified ?  ? ? ?Time: 8416-6063 ?PT Time Calculation (min) (ACUTE ONLY): 16 min ? ?Charges:  $Gait Training: 8-22 mins          ?          ? ?Arlyss Gandy, PT, DPT ?Acute Rehabilitation ?Pager: [phone] ?Office [phone] ? ? ? ?Arlyss Gandy ?07/10/2021, 9:39 AM ? ?

## 2021-07-10 NOTE — ED Provider Notes (Signed)
?Provider Note ? ? ?CSN: [id number] ?Arrival date & time: 07/08/21  2106 ? ?  ? ?History ? ?Chief Complaint  ?Patient presents with  ? Weakness  ? Code Stroke  ? ? ?Curtis SkillCarl K Karaffa Jr. is a 46 y.o. male.  Presenting to ER due to concern for sudden onset left sided weakness, numbness.  States that he is having numb/tingling sensation and left arm and left leg and feels very heavy and weak.  Also having some chest discomfort.  He denies any back pain.  Not described as severe or ripping.  No speech change. ? ?History limited due to acuity.  Made stroke alert in triage. ? ?HPI ? ?  ? ?Home Medications ?Prior to Admission medications   ?Medication Sig Start Date End Date Taking? Authorizing Provider  ?amLODipine (NORVASC) 5 MG tablet Take 5 mg by mouth daily. 07/07/21  Yes [provider]  ?lidocaine (LIDODERM) 5 % Place 1 patch onto the skin daily. For shoulder 07/07/21  Yes [provider]  ?acetaminophen (TYLENOL) 325 MG tablet Take 650 mg by mouth every 6 (six) hours as needed for moderate pain. ?Patient not taking: Reported on 07/08/2021    [provider]  ?aspirin EC 81 MG EC tablet Take 1 tablet (81 mg total) by mouth daily. Swallow whole. 07/10/21   Elmer PickerShafer, Devon, NP  ?docusate sodium (COLACE) 100 MG capsule Take 1 tablet once or twice daily as needed for constipation while taking narcotic pain medicine ?Patient not taking: Reported on 07/08/2021 11/06/20   Loleta RoseForbach, Cory, MD  ?ibuprofen (ADVIL) 200 MG tablet Take 400 mg by mouth every 6 (six) hours as needed for moderate pain. ?Patient not taking: Reported on 07/08/2021    [provider]  ?rosuvastatin (CRESTOR) 5 MG tablet Take 1 tablet (5 mg total) by mouth daily. 07/10/21   Elmer PickerShafer, Devon, NP  ?   ? ?Allergies    ?Oxycodone-acetaminophen and Percocet [oxycodone-acetaminophen]   ? ?Review of Systems   ?Review of Systems  ?Unable to perform ROS: Acuity of condition  ? ?Physical Exam ?Updated Vital Signs ?BP (!) 139/94   Pulse 77   Temp 98 ?F  (36.7 ?C) (Oral)   Resp 15   Ht 5\' 10"  (1.778 m)   Wt 98.5 kg   SpO2 100%   BMI 31.16 kg/m?  ?Physical Exam ?Vitals and nursing note reviewed.  ?Constitutional:   ?   General: He is not in acute distress. ?   Appearance: He is well-developed.  ?HENT:  ?   Head: Normocephalic and atraumatic.  ?Eyes:  ?   Conjunctiva/sclera: Conjunctivae normal.  ?Cardiovascular:  ?   Rate and Rhythm: Normal rate and regular rhythm.  ?   Heart sounds: No murmur heard. ?Pulmonary:  ?   Effort: Pulmonary effort is normal. No respiratory distress.  ?   Breath sounds: Normal breath sounds.  ?Abdominal:  ?   Palpations: Abdomen is soft.  ?   Tenderness: There is no abdominal tenderness.  ?Musculoskeletal:     ?   General: No swelling.  ?   Cervical back: Neck supple.  ?   Comments: Alert and oriented x3, speech is clear, cranial nerves II through XII are intact, 5 out of 5 strength in right upper and lower extremities, 4 out of 5 strength in left upper and lower extremities, some sensation decreased in left upper and lower extremities.  Sensation is intact in right upper and lower extremity.  ?Skin: ?   General: Skin is warm  and dry.  ?   Capillary Refill: Capillary refill takes less than 2 seconds.  ?Neurological:  ?   Mental Status: He is alert.  ?Psychiatric:     ?   Mood and Affect: Mood normal.  ? ? ?ED Results / Procedures / Treatments   ?Labs ?(all labs ordered are listed, but only abnormal results are displayed) ?Labs Reviewed  ?COMPREHENSIVE METABOLIC PANEL - Abnormal; Notable for the following components:  ?    Result Value  ? Glucose, Bld 101 (*)   ? BUN 24 (*)   ? All other components within normal limits  ?URINALYSIS, ROUTINE W REFLEX MICROSCOPIC - Abnormal; Notable for the following components:  ? Hgb urine dipstick SMALL (*)   ? All other components within normal limits  ?I-STAT CHEM 8, ED - Abnormal; Notable for the following components:  ? BUN 26 (*)   ? All other components within normal limits  ?RESP PANEL BY RT-PCR  (FLU A&B, COVID) [id number]  ?MRSA NEXT GEN BY PCR, NASAL  ?ETHANOL  ?PROTIME-INR  ?APTT  ?CBC  ?DIFFERENTIAL  ?RAPID URINE DRUG SCREEN, HOSP PERFORMED  ?LIPID PANEL  ?HEMOGLOBIN A1C  ?CBG MONITORING, ED  ?TROPONIN I (HIGH SENSITIVITY)  ?TROPONIN I (HIGH SENSITIVITY)  ? ? ?EKG ?EKG Interpretation ? ?Date/Time:  Thursday Jul 08 2021 22:19:26 EDT ?Ventricular Rate:  76 ?PR Interval:  161 ?QRS Duration: 101 ?QT Interval:  381 ?QTC Calculation: 429 ?R Axis:   50 ?Text Interpretation: Sinus rhythm Confirmed by Virgina Norfolk ([id number]) on 07/09/2021 8:57:27 AM ? ?Radiology ?MR BRAIN WO CONTRAST ? ?Result Date: 07/09/2021 ?CLINICAL DATA:  Left arm and leg weakness EXAM: MRI HEAD WITHOUT CONTRAST MRI CERVICAL SPINE WITHOUT CONTRAST TECHNIQUE: Multiplanar, multiecho pulse sequences of the brain and surrounding structures, and cervical spine, to include the craniocervical junction and cervicothoracic junction, were obtained without intravenous contrast. COMPARISON:  None Available. FINDINGS: MRI HEAD FINDINGS Brain: No acute infarct, mass effect or extra-axial collection. No acute or chronic hemorrhage. Normal white matter signal, parenchymal volume and CSF spaces. Incidentally noted cavum septum pellucidum et vergae. Vascular: Major flow voids are preserved. Skull and upper cervical spine: Normal calvarium and skull base. Visualized upper cervical spine and soft tissues are normal. Sinuses/Orbits:No paranasal sinus fluid levels or advanced mucosal thickening. No mastoid or middle ear effusion. Normal orbits. MRI CERVICAL SPINE FINDINGS Alignment: Physiologic. Vertebrae: No fracture, evidence of discitis, or bone lesion. Cord: Normal signal and morphology. Posterior Fossa, vertebral arteries, paraspinal tissues: Negative. Disc levels: C1-2: Unremarkable. C2-3: Normal disc space and facet joints. There is no spinal canal stenosis. No neural foraminal stenosis. C3-4: Normal disc space and facet joints. There is no spinal canal stenosis. No  neural foraminal stenosis. C4-5: Small disc bulge. There is no spinal canal stenosis. No neural foraminal stenosis. C5-6: Small disc bulge. There is no spinal canal stenosis. Moderate left neural foraminal stenosis. C6-7: Small disc bulge. There is no spinal canal stenosis. Moderate right and severe left neural foraminal stenosis. C7-T1: Normal disc space and facet joints. There is no spinal canal stenosis. No neural foraminal stenosis. IMPRESSION: 1. Normal MRI of the brain. 2. Moderate left C5-6 and severe left C6-7 neural foraminal stenosis. 3. No spinal canal stenosis. Electronically Signed   By: Deatra Robinson M.D.   On: 07/09/2021 20:59  ? ?MR BRAIN WO CONTRAST ? ?Result Date: 07/09/2021 ?CLINICAL DATA:  Stroke follow-up EXAM: MRI HEAD WITHOUT CONTRAST TECHNIQUE: Multiplanar, multiecho pulse sequences of the brain and surrounding structures were obtained without  intravenous contrast. COMPARISON:  None Available. FINDINGS: Brain: No acute infarct, mass effect or extra-axial collection. No acute or chronic hemorrhage. Normal white matter signal, parenchymal volume and CSF spaces. Cavum septum pellucidum et vergae Vascular: Major flow voids are preserved. Skull and upper cervical spine: Normal calvarium and skull base. Visualized upper cervical spine and soft tissues are normal. Sinuses/Orbits:No paranasal sinus fluid levels or advanced mucosal thickening. No mastoid or middle ear effusion. Normal orbits. IMPRESSION: Normal brain MRI. Electronically Signed   By: Deatra Robinson M.D.   On: 07/09/2021 03:25  ? ?MR CERVICAL SPINE WO CONTRAST ? ?Result Date: 07/09/2021 ?CLINICAL DATA:  Left arm and leg weakness EXAM: MRI HEAD WITHOUT CONTRAST MRI CERVICAL SPINE WITHOUT CONTRAST TECHNIQUE: Multiplanar, multiecho pulse sequences of the brain and surrounding structures, and cervical spine, to include the craniocervical junction and cervicothoracic junction, were obtained without intravenous contrast. COMPARISON:  None  Available. FINDINGS: MRI HEAD FINDINGS Brain: No acute infarct, mass effect or extra-axial collection. No acute or chronic hemorrhage. Normal white matter signal, parenchymal volume and CSF spaces. Incidentally no

## 2021-08-23 ENCOUNTER — Telehealth: Payer: Self-pay | Admitting: Neurology

## 2021-08-23 NOTE — Telephone Encounter (Signed)
Rescheduled 6/20 appt with pt over the phone- MD out. 

## 2021-08-24 ENCOUNTER — Ambulatory Visit: Payer: PRIVATE HEALTH INSURANCE | Admitting: Neurology

## 2021-08-31 ENCOUNTER — Ambulatory Visit: Payer: PRIVATE HEALTH INSURANCE | Admitting: Neurology

## 2021-08-31 ENCOUNTER — Encounter: Payer: Self-pay | Admitting: Neurology

## 2021-08-31 VITALS — BP 126/87 | HR 64 | Ht 70.0 in | Wt 196.0 lb

## 2021-08-31 DIAGNOSIS — R202 Paresthesia of skin: Secondary | ICD-10-CM | POA: Insufficient documentation

## 2021-08-31 NOTE — Progress Notes (Signed)
Chief Complaint  Patient presents with   New Patient (Initial Visit)    Rm 14. Accompanied by girlfriend. NP ED referral for TIA.      ASSESSMENT AND PLAN  Curtis Brown. is a 46 y.o. 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, no evidence of spinal cord compression His intermittent left leg numbness was traveling along the left lateral leg, in the distribution of left L4, His complaints of intermittent left upper and lower extremity paresthesia can be related to his left cervical and the possible left lumbar radiculopathy  He is functioning well, no significant pain, wants to hold off further evaluation such as EMG nerve conduction study, MRI of lumbar spine at this point,  Extensive evaluation at the hospital was normal, including normal MRI of the brain, CT angiogram of head and neck, does not support a diagnosis of TIA   Will continue moderate exercise, return to clinic as needed  DIAGNOSTIC DATA (LABS, IMAGING, TESTING) - I reviewed patient records, labs, notes, testing and imaging myself where available.   MEDICAL HISTORY:  Curtis Brown. is a 46 year old male, accompanied by his girlfriend seen in request by nurse practitioner Elmer Picker, for evaluation of paresthesia, initial evaluation was on August 31, 2021  I reviewed and summarized the referring note. PMHX. HTN HLD Obstructive Sleep apnea. Kidney Stone, Left leg fracture,   On Jul 08, 2021, while sitting there, he did not feel well, then noticed left-sided numbness involving left arm and leg, but no weakness, no facial involvement, presented to emergency room, while getting out of the wheelchair at emergency room, he also noticed left leg weakness, difficulty bearing weight, his left-sided numbness and weakness lasted for 2  days  He is not back to his baseline, denies any difficulty, including heavy lifting, only has intermittent numbness in his left arm, leg transient,  Reviewed extensive evaluation, MRI of the brain showed no acute abnormality CT angiogram of head and neck was normal, no evidence of large vessel disease  MRI of cervical spine showed moderate left C5-6, severe left C6-7 foraminal stenosis, no evidence of cord compression  Laboratory evaluations, LDL 82, negative troponin, A1c 5.2, UDS was negative,  PHYSICAL EXAM:   Vitals:   08/31/21 0932  BP: 126/87  Pulse: 64  Weight: 196 lb (88.9 kg)  Height: 5\' 10"  (1.778 m)   Not recorded     Body mass index is 28.12 kg/m.  PHYSICAL EXAMNIATION:  Gen: NAD, conversant, well nourised, well groomed                     Cardiovascular: Regular rate rhythm, no peripheral edema, warm, nontender. Eyes: Conjunctivae clear without exudates or hemorrhage Neck: Supple, no carotid bruits. Pulmonary: Clear to auscultation bilaterally   NEUROLOGICAL EXAM:  MENTAL STATUS: Speech/cognition: Awake, alert, oriented to history taking and casual conversation CRANIAL NERVES: CN II: Visual fields are full to confrontation. Pupils are round equal and briskly reactive to light. CN III, IV, VI: extraocular movement are normal. No ptosis. CN V: Facial sensation is intact to light touch CN VII: Face is symmetric with normal eye closure  CN VIII: Hearing is normal to causal conversation. CN IX, X: Phonation is normal. CN XI: Head turning and shoulder shrug are intact  MOTOR: Mild left shoulder abduction, external rotation weakness  REFLEXES: Reflexes are 1 and symmetric at the biceps, triceps, knees, and ankles. Plantar responses are flexor.  SENSORY: Intact to light touch, pinprick and vibratory sensation are intact in fingers and toes.  COORDINATION: There is no trunk or limb dysmetria noted.  GAIT/STANCE: Posture is normal. Gait is steady with  normal steps, base, arm swing, and turning. Heel and toe walking are normal. Tandem gait is normal.  Romberg is absent.  REVIEW OF SYSTEMS:  Full 14 system review of systems performed and notable only for as above All other review of systems were negative.   ALLERGIES: Allergies  Allergen Reactions   Oxycodone-Acetaminophen Hives    Can take Tylenol    Percocet [Oxycodone-Acetaminophen] Hives    HOME MEDICATIONS: Current Outpatient Medications  Medication Sig Dispense Refill   amLODipine (NORVASC) 5 MG tablet Take 5 mg by mouth daily.     aspirin EC 81 MG EC tablet Take 1 tablet (81 mg total) by mouth daily. Swallow whole. 30 tablet 11   rosuvastatin (CRESTOR) 5 MG tablet Take 1 tablet (5 mg total) by mouth daily. 30 tablet 2   No current facility-administered medications for this visit.    PAST MEDICAL HISTORY: Past Medical History:  Diagnosis Date   Anginal pain ([place])    Complication of anesthesia    DDD (degenerative disc disease), lumbar    Family history of adverse reaction to anesthesia    PONV   History of kidney stones    Hypertension    diet controlled   Kidney stone    PONV (postoperative nausea and vomiting)    Sleep apnea    no CPAP    PAST SURGICAL HISTORY: Past Surgical History:  Procedure Laterality Date   arthroscopic knee Left 2015   x 2 surgeries   CYSTOSCOPY W/ URETERAL STENT PLACEMENT Left 11/07/2020   Procedure: CYSTOSCOPY WITH RETROGRADE PYELOGRAM/URETERAL STENT PLACEMENT;  Surgeon: Irine Seal, MD;  Location: [facility];  Service: Urology;  Laterality: Left;   CYSTOSCOPY/URETEROSCOPY/HOLMIUM LASER/STENT PLACEMENT Left 12/11/2020   Procedure: CYSTOSCOPY/URETEROSCOPY/HOLMIUM LASER/STENT EXCHANGE;  Surgeon: Billey Co, MD;  Location: [facility];  Service: Urology;  Laterality: Left;   FRACTURE SURGERY     tibia with rod 2000   HERNIA REPAIR     INGUINAL HERNIA REPAIR     LEG SURGERY      FAMILY HISTORY: History reviewed. No pertinent  family history.  SOCIAL HISTORY: Social History   Socioeconomic History   Marital status: Single    Spouse name: Not on file   Number of children: Not on file   Years of education: Not on file   Highest education level: Not on file  Occupational History   Not on file  Tobacco Use   Smoking status: Never   Smokeless tobacco: Never  Substance and Sexual Activity   Alcohol use: Yes    Comment: occassionally   Drug use: No   Sexual activity: Not on file  Other Topics Concern   Not on file  Social History Narrative   Lives with Dad   Social Determinants of Health   Financial Resource Strain: Not on file  Food Insecurity: Not on file  Transportation Needs: Not on file  Physical Activity: Not on file  Stress: Not on file  Social Connections: Not on file  Intimate Partner Violence: Not on file      Marcial Pacas, M.D. Ph.D.  [facility] [address] [place], [place] [address] Ph: [phone] Fax: [phone]  CC:  Elmer Picker, NP [address], [address] [place],  [place] [address]  Pcp, No

## 2021-10-21 ENCOUNTER — Other Ambulatory Visit: Payer: Self-pay

## 2021-10-21 NOTE — Patient Outreach (Signed)
[facility]) Care Management  10/21/2021  Curtis Brown. April 23, 1975 [id number]   Second telephone outreach attempt to obtain mRS. No answer. Left message for returned call.  Vanice Sarah [facility] Management Assistant [phone]

## 2021-10-22 ENCOUNTER — Other Ambulatory Visit: Payer: Self-pay

## 2021-10-22 NOTE — Patient Outreach (Signed)
[facility]) Care Management  10/22/2021  Derek Laughter. 12/30/1975 [id number]   3 outreach attempts were completed to obtain mRs. mRs could not be obtained because patient never returned my calls. mRs=7    Vanice Sarah Care Management Assistant [phone]

## 2023-07-17 ENCOUNTER — Other Ambulatory Visit: Payer: Self-pay

## 2023-07-17 DIAGNOSIS — R55 Syncope and collapse: Secondary | ICD-10-CM

## 2023-07-28 ENCOUNTER — Ambulatory Visit
Admission: RE | Admit: 2023-07-28 | Discharge: 2023-07-28 | Disposition: A | Discharge status: Home / Self Care | Payer: PRIVATE HEALTH INSURANCE | Source: Ambulatory Visit

## 2023-07-28 DIAGNOSIS — R55 Syncope and collapse: Secondary | ICD-10-CM
